# Patient Record
Sex: Male | Born: 1950 | Race: White | Hispanic: No | Smoking: Former smoker
Health system: Southern US, Community
[De-identification: ages and names within clinical notes are randomized; demographics above are authoritative.]

## PROBLEM LIST (undated history)

## (undated) DIAGNOSIS — M1990 Unspecified osteoarthritis, unspecified site: Secondary | ICD-10-CM

## (undated) DIAGNOSIS — R7303 Prediabetes: Secondary | ICD-10-CM

## (undated) DIAGNOSIS — I10 Essential (primary) hypertension: Secondary | ICD-10-CM

## (undated) DIAGNOSIS — K219 Gastro-esophageal reflux disease without esophagitis: Secondary | ICD-10-CM

## (undated) HISTORY — PX: UPPER GI ENDOSCOPY: SHX6162

## (undated) HISTORY — PX: COLONOSCOPY: SHX174

## (undated) HISTORY — PX: FOOT SURGERY: SHX648

---

## 2007-12-09 ENCOUNTER — Ambulatory Visit (HOSPITAL_COMMUNITY): Admission: RE | Admit: 2007-12-09 | Discharge: 2007-12-09 | Payer: Self-pay | Admitting: *Deleted

## 2007-12-09 ENCOUNTER — Encounter (INDEPENDENT_AMBULATORY_CARE_PROVIDER_SITE_OTHER): Payer: Self-pay | Admitting: *Deleted

## 2011-02-10 NOTE — Op Note (Signed)
NAME:  Ronnie Moody, Ronnie Moody NO.:  [id number]   MEDICAL RECORD NO.:  [id number]          PATIENT TYPE:  AMB   LOCATION:  ENDO                         FACILITY:  [facility]   PHYSICIAN:  Georgiana Spinner, M.D.    DATE OF BIRTH:  12/14/1950   DATE OF PROCEDURE:  12/09/2007  DATE OF DISCHARGE:                               OPERATIVE REPORT   PROCEDURE:  Colonoscopy.   ENDOSCOPIST:  Georgiana Spinner, M.D.   INDICATIONS:  Colon cancer screening.   ANESTHESIA:  Demerol 30, Versed 3 mg.   PROCEDURE:  With the patient mildly sedated in the left lateral  decubitus position, a rectal examination was performed which was  unremarkable to my examination.  The prostate felt normal. Subsequently,  the Pentax videoscopic colonoscope was inserted in the rectum and passed  under direct vision with pressure applied to reach the cecum, identified  by ileocecal valve and appendiceal orifice, both which were  photographed.  From this point, the colonoscope was slowly withdrawn,  taking circumferential views of the colonic mucosa, stopping only at  approximately 25 cm from anal verge, at which point approximately a 7-mm  polyp was seen, photographed and removed using snare cautery technique,  setting of 20/150 blended current.  The tissue was retrieved for  Pathology, suctioning it into a tissue trap.  The endoscope was then  withdrawn all the way to the rectum, which appeared normal on direct and  retroflexed views.  The endoscope was straightened and withdrawn.  The  patient's vital signs and pulse oximetry remained stable.  The patient  tolerated procedure well without apparent complications.   FINDINGS:  Polyp at 25 cm, otherwise unremarkable exam.   PLAN:  Await biopsy report.  The patient will call me for results and  follow up with me as an outpatient as indicated.  See endoscopy note as  well for followup.           ______________________________  Georgiana Spinner, M.D.     GMO/MEDQ   D:  12/09/2007  T:  12/10/2007  Job:  [id number]   cc:   Caryn Bee L. Little, M.D.  Fax: [phone]

## 2011-02-10 NOTE — Op Note (Signed)
NAME:  Ronnie Moody, Ronnie Moody NO.:  [id number]   MEDICAL RECORD NO.:  [id number]          PATIENT TYPE:  AMB   LOCATION:  ENDO                         FACILITY:  [facility]   PHYSICIAN:  Georgiana Spinner, M.D.    DATE OF BIRTH:  1951/02/11   DATE OF PROCEDURE:  12/09/2007  DATE OF DISCHARGE:                               OPERATIVE REPORT   PROCEDURE:  Upper endoscopy.   INDICATIONS:  GERD.   ANESTHESIA:  Demerol 70 mg, Versed 7 mg.   PROCEDURE:  With the patient mildly sedated in the left lateral  decubitus position, the Pentax videoscopic endoscope was inserted the  mouth, passed under direct vision through the esophagus, which appeared  normal until we reached distal esophagus, and there were clear-cut  changes of Barrett's, photographed, and multiple biopsies taken.  We  then entered into the stomach.  Fundus, body, antrum, duodenal bulb,  second portion of duodenum were visualized and appeared normal.  From  this point the endoscope was slowly withdrawn taking circumferential  views of the duodenal mucosa until the endoscope had been pulled back  into stomach, placed in retroflexion to view the stomach from below.  The endoscope was straightened and withdrawn taking circumferential  views of the remaining gastric and esophageal mucosa.  The patient's  vital signs and pulse oximeter remained stable.  The patient tolerated  the procedure well without apparent complications.   FINDINGS:  Endoscopic findings of Barrett's esophagus, biopsied.   Await biopsy report.  The patient will call me for results and follow up  with me as an outpatient.           ______________________________  Georgiana Spinner, M.D.     GMO/MEDQ  D:  12/09/2007  T:  12/10/2007  Job:  [id number]   cc:   Caryn Bee L. Little, M.D.  Fax: [phone]

## 2013-05-04 ENCOUNTER — Other Ambulatory Visit: Payer: Self-pay | Admitting: Dermatology

## 2013-08-08 ENCOUNTER — Other Ambulatory Visit: Payer: Self-pay | Admitting: Gastroenterology

## 2014-03-21 ENCOUNTER — Other Ambulatory Visit: Payer: Self-pay | Admitting: Dermatology

## 2014-12-06 ENCOUNTER — Other Ambulatory Visit: Payer: Self-pay | Admitting: Dermatology

## 2016-01-21 DIAGNOSIS — L570 Actinic keratosis: Secondary | ICD-10-CM | POA: Diagnosis not present

## 2016-03-12 DIAGNOSIS — J0100 Acute maxillary sinusitis, unspecified: Secondary | ICD-10-CM | POA: Diagnosis not present

## 2016-04-21 DIAGNOSIS — D0439 Carcinoma in situ of skin of other parts of face: Secondary | ICD-10-CM | POA: Diagnosis not present

## 2016-04-21 DIAGNOSIS — D2272 Melanocytic nevi of left lower limb, including hip: Secondary | ICD-10-CM | POA: Diagnosis not present

## 2016-04-21 DIAGNOSIS — L821 Other seborrheic keratosis: Secondary | ICD-10-CM | POA: Diagnosis not present

## 2016-04-21 DIAGNOSIS — D044 Carcinoma in situ of skin of scalp and neck: Secondary | ICD-10-CM | POA: Diagnosis not present

## 2016-04-21 DIAGNOSIS — L570 Actinic keratosis: Secondary | ICD-10-CM | POA: Diagnosis not present

## 2016-04-21 DIAGNOSIS — D485 Neoplasm of uncertain behavior of skin: Secondary | ICD-10-CM | POA: Diagnosis not present

## 2016-04-21 DIAGNOSIS — Z85828 Personal history of other malignant neoplasm of skin: Secondary | ICD-10-CM | POA: Diagnosis not present

## 2016-07-15 DIAGNOSIS — E7800 Pure hypercholesterolemia, unspecified: Secondary | ICD-10-CM | POA: Diagnosis not present

## 2016-07-15 DIAGNOSIS — Z0000 Encounter for general adult medical examination without abnormal findings: Secondary | ICD-10-CM | POA: Diagnosis not present

## 2016-07-15 DIAGNOSIS — R7301 Impaired fasting glucose: Secondary | ICD-10-CM | POA: Diagnosis not present

## 2016-07-15 DIAGNOSIS — Z23 Encounter for immunization: Secondary | ICD-10-CM | POA: Diagnosis not present

## 2016-07-15 DIAGNOSIS — Z125 Encounter for screening for malignant neoplasm of prostate: Secondary | ICD-10-CM | POA: Diagnosis not present

## 2016-08-07 DIAGNOSIS — Z85828 Personal history of other malignant neoplasm of skin: Secondary | ICD-10-CM | POA: Diagnosis not present

## 2016-08-07 DIAGNOSIS — L570 Actinic keratosis: Secondary | ICD-10-CM | POA: Diagnosis not present

## 2016-08-07 DIAGNOSIS — D2272 Melanocytic nevi of left lower limb, including hip: Secondary | ICD-10-CM | POA: Diagnosis not present

## 2017-02-24 DIAGNOSIS — L821 Other seborrheic keratosis: Secondary | ICD-10-CM | POA: Diagnosis not present

## 2017-02-24 DIAGNOSIS — D2272 Melanocytic nevi of left lower limb, including hip: Secondary | ICD-10-CM | POA: Diagnosis not present

## 2017-02-24 DIAGNOSIS — L570 Actinic keratosis: Secondary | ICD-10-CM | POA: Diagnosis not present

## 2017-02-24 DIAGNOSIS — Z85828 Personal history of other malignant neoplasm of skin: Secondary | ICD-10-CM | POA: Diagnosis not present

## 2017-04-09 DIAGNOSIS — J320 Chronic maxillary sinusitis: Secondary | ICD-10-CM | POA: Diagnosis not present

## 2017-04-09 DIAGNOSIS — M25511 Pain in right shoulder: Secondary | ICD-10-CM | POA: Diagnosis not present

## 2017-07-22 DIAGNOSIS — L570 Actinic keratosis: Secondary | ICD-10-CM | POA: Diagnosis not present

## 2017-07-22 DIAGNOSIS — Z0000 Encounter for general adult medical examination without abnormal findings: Secondary | ICD-10-CM | POA: Diagnosis not present

## 2017-07-22 DIAGNOSIS — J309 Allergic rhinitis, unspecified: Secondary | ICD-10-CM | POA: Diagnosis not present

## 2017-07-22 DIAGNOSIS — I10 Essential (primary) hypertension: Secondary | ICD-10-CM | POA: Diagnosis not present

## 2017-07-23 DIAGNOSIS — K4090 Unilateral inguinal hernia, without obstruction or gangrene, not specified as recurrent: Secondary | ICD-10-CM | POA: Diagnosis not present

## 2017-07-23 DIAGNOSIS — N400 Enlarged prostate without lower urinary tract symptoms: Secondary | ICD-10-CM | POA: Diagnosis not present

## 2017-07-23 DIAGNOSIS — I10 Essential (primary) hypertension: Secondary | ICD-10-CM | POA: Diagnosis not present

## 2017-07-23 DIAGNOSIS — Z0000 Encounter for general adult medical examination without abnormal findings: Secondary | ICD-10-CM | POA: Diagnosis not present

## 2017-07-23 DIAGNOSIS — Z125 Encounter for screening for malignant neoplasm of prostate: Secondary | ICD-10-CM | POA: Diagnosis not present

## 2017-07-23 DIAGNOSIS — E7800 Pure hypercholesterolemia, unspecified: Secondary | ICD-10-CM | POA: Diagnosis not present

## 2017-07-28 DIAGNOSIS — M19012 Primary osteoarthritis, left shoulder: Secondary | ICD-10-CM | POA: Diagnosis not present

## 2017-07-28 DIAGNOSIS — M19011 Primary osteoarthritis, right shoulder: Secondary | ICD-10-CM | POA: Diagnosis not present

## 2017-08-11 DIAGNOSIS — M25511 Pain in right shoulder: Secondary | ICD-10-CM | POA: Diagnosis not present

## 2017-08-11 DIAGNOSIS — G8929 Other chronic pain: Secondary | ICD-10-CM | POA: Diagnosis not present

## 2017-08-11 DIAGNOSIS — M25512 Pain in left shoulder: Secondary | ICD-10-CM | POA: Diagnosis not present

## 2017-08-11 DIAGNOSIS — M19012 Primary osteoarthritis, left shoulder: Secondary | ICD-10-CM | POA: Diagnosis not present

## 2017-08-23 DIAGNOSIS — M19011 Primary osteoarthritis, right shoulder: Secondary | ICD-10-CM | POA: Diagnosis not present

## 2017-08-27 DIAGNOSIS — L821 Other seborrheic keratosis: Secondary | ICD-10-CM | POA: Diagnosis not present

## 2017-08-27 DIAGNOSIS — D2272 Melanocytic nevi of left lower limb, including hip: Secondary | ICD-10-CM | POA: Diagnosis not present

## 2017-08-27 DIAGNOSIS — D485 Neoplasm of uncertain behavior of skin: Secondary | ICD-10-CM | POA: Diagnosis not present

## 2017-08-27 DIAGNOSIS — D2271 Melanocytic nevi of right lower limb, including hip: Secondary | ICD-10-CM | POA: Diagnosis not present

## 2017-08-27 DIAGNOSIS — L570 Actinic keratosis: Secondary | ICD-10-CM | POA: Diagnosis not present

## 2017-09-08 DIAGNOSIS — M19011 Primary osteoarthritis, right shoulder: Secondary | ICD-10-CM | POA: Diagnosis not present

## 2018-02-24 DIAGNOSIS — D2272 Melanocytic nevi of left lower limb, including hip: Secondary | ICD-10-CM | POA: Diagnosis not present

## 2018-02-24 DIAGNOSIS — D2271 Melanocytic nevi of right lower limb, including hip: Secondary | ICD-10-CM | POA: Diagnosis not present

## 2018-02-24 DIAGNOSIS — L570 Actinic keratosis: Secondary | ICD-10-CM | POA: Diagnosis not present

## 2018-02-24 DIAGNOSIS — Z85828 Personal history of other malignant neoplasm of skin: Secondary | ICD-10-CM | POA: Diagnosis not present

## 2018-07-14 DIAGNOSIS — D485 Neoplasm of uncertain behavior of skin: Secondary | ICD-10-CM | POA: Diagnosis not present

## 2018-07-14 DIAGNOSIS — D044 Carcinoma in situ of skin of scalp and neck: Secondary | ICD-10-CM | POA: Diagnosis not present

## 2018-07-14 DIAGNOSIS — Z23 Encounter for immunization: Secondary | ICD-10-CM | POA: Diagnosis not present

## 2018-08-10 DIAGNOSIS — D044 Carcinoma in situ of skin of scalp and neck: Secondary | ICD-10-CM | POA: Diagnosis not present

## 2018-08-31 DIAGNOSIS — R7301 Impaired fasting glucose: Secondary | ICD-10-CM | POA: Diagnosis not present

## 2018-08-31 DIAGNOSIS — Z8719 Personal history of other diseases of the digestive system: Secondary | ICD-10-CM | POA: Diagnosis not present

## 2018-08-31 DIAGNOSIS — R8290 Unspecified abnormal findings in urine: Secondary | ICD-10-CM | POA: Diagnosis not present

## 2018-08-31 DIAGNOSIS — Z0000 Encounter for general adult medical examination without abnormal findings: Secondary | ICD-10-CM | POA: Diagnosis not present

## 2018-08-31 DIAGNOSIS — Z23 Encounter for immunization: Secondary | ICD-10-CM | POA: Diagnosis not present

## 2018-08-31 DIAGNOSIS — I10 Essential (primary) hypertension: Secondary | ICD-10-CM | POA: Diagnosis not present

## 2018-08-31 DIAGNOSIS — H6122 Impacted cerumen, left ear: Secondary | ICD-10-CM | POA: Diagnosis not present

## 2018-09-02 ENCOUNTER — Other Ambulatory Visit: Payer: Self-pay | Admitting: Family Medicine

## 2018-09-02 DIAGNOSIS — Z87891 Personal history of nicotine dependence: Secondary | ICD-10-CM

## 2018-09-08 ENCOUNTER — Ambulatory Visit: Payer: Self-pay

## 2018-09-14 ENCOUNTER — Ambulatory Visit
Admission: RE | Admit: 2018-09-14 | Discharge: 2018-09-14 | Disposition: A | Discharge status: Home / Self Care | Payer: BLUE CROSS/BLUE SHIELD | Source: Ambulatory Visit | Attending: Family Medicine | Admitting: Family Medicine

## 2018-09-14 DIAGNOSIS — Z136 Encounter for screening for cardiovascular disorders: Secondary | ICD-10-CM | POA: Diagnosis not present

## 2018-09-14 DIAGNOSIS — Z87891 Personal history of nicotine dependence: Secondary | ICD-10-CM

## 2018-10-03 DIAGNOSIS — K4020 Bilateral inguinal hernia, without obstruction or gangrene, not specified as recurrent: Secondary | ICD-10-CM | POA: Diagnosis not present

## 2018-10-07 DIAGNOSIS — L570 Actinic keratosis: Secondary | ICD-10-CM | POA: Diagnosis not present

## 2018-10-07 DIAGNOSIS — D485 Neoplasm of uncertain behavior of skin: Secondary | ICD-10-CM | POA: Diagnosis not present

## 2018-10-07 DIAGNOSIS — Z85828 Personal history of other malignant neoplasm of skin: Secondary | ICD-10-CM | POA: Diagnosis not present

## 2018-10-07 DIAGNOSIS — D045 Carcinoma in situ of skin of trunk: Secondary | ICD-10-CM | POA: Diagnosis not present

## 2018-10-07 DIAGNOSIS — L821 Other seborrheic keratosis: Secondary | ICD-10-CM | POA: Diagnosis not present

## 2018-10-07 DIAGNOSIS — D2272 Melanocytic nevi of left lower limb, including hip: Secondary | ICD-10-CM | POA: Diagnosis not present

## 2018-10-07 DIAGNOSIS — D2271 Melanocytic nevi of right lower limb, including hip: Secondary | ICD-10-CM | POA: Diagnosis not present

## 2018-11-24 DIAGNOSIS — D045 Carcinoma in situ of skin of trunk: Secondary | ICD-10-CM | POA: Diagnosis not present

## 2019-04-06 DIAGNOSIS — L814 Other melanin hyperpigmentation: Secondary | ICD-10-CM | POA: Diagnosis not present

## 2019-04-06 DIAGNOSIS — D045 Carcinoma in situ of skin of trunk: Secondary | ICD-10-CM | POA: Diagnosis not present

## 2019-04-06 DIAGNOSIS — D2272 Melanocytic nevi of left lower limb, including hip: Secondary | ICD-10-CM | POA: Diagnosis not present

## 2019-04-06 DIAGNOSIS — L821 Other seborrheic keratosis: Secondary | ICD-10-CM | POA: Diagnosis not present

## 2019-04-06 DIAGNOSIS — D2271 Melanocytic nevi of right lower limb, including hip: Secondary | ICD-10-CM | POA: Diagnosis not present

## 2019-04-06 DIAGNOSIS — L570 Actinic keratosis: Secondary | ICD-10-CM | POA: Diagnosis not present

## 2019-04-06 DIAGNOSIS — Z85828 Personal history of other malignant neoplasm of skin: Secondary | ICD-10-CM | POA: Diagnosis not present

## 2019-04-06 DIAGNOSIS — D485 Neoplasm of uncertain behavior of skin: Secondary | ICD-10-CM | POA: Diagnosis not present

## 2019-05-02 DIAGNOSIS — M25512 Pain in left shoulder: Secondary | ICD-10-CM | POA: Diagnosis not present

## 2019-05-02 DIAGNOSIS — M25511 Pain in right shoulder: Secondary | ICD-10-CM | POA: Diagnosis not present

## 2019-05-03 DIAGNOSIS — A499 Bacterial infection, unspecified: Secondary | ICD-10-CM | POA: Diagnosis not present

## 2019-05-03 DIAGNOSIS — L98491 Non-pressure chronic ulcer of skin of other sites limited to breakdown of skin: Secondary | ICD-10-CM | POA: Diagnosis not present

## 2019-05-15 IMAGING — US US ABDOMINAL AORTA SCREENING AAA
1 series · 7 of 7 positions shown · non-contrast
Comparison: None.

CLINICAL DATA: Male between 65-75 years of age with a smoking
history.

EXAM:
US ABDOMINAL AORTA MEDICARE SCREENING
TECHNIQUE: Ultrasound examination of the abdominal aorta was performed as a
screening evaluation for abdominal aortic aneurysm.

[Series 1: us abdominal aorta screening aaa · 0.22mm/px · 7 of 7 slices shown]
[im 1/7]
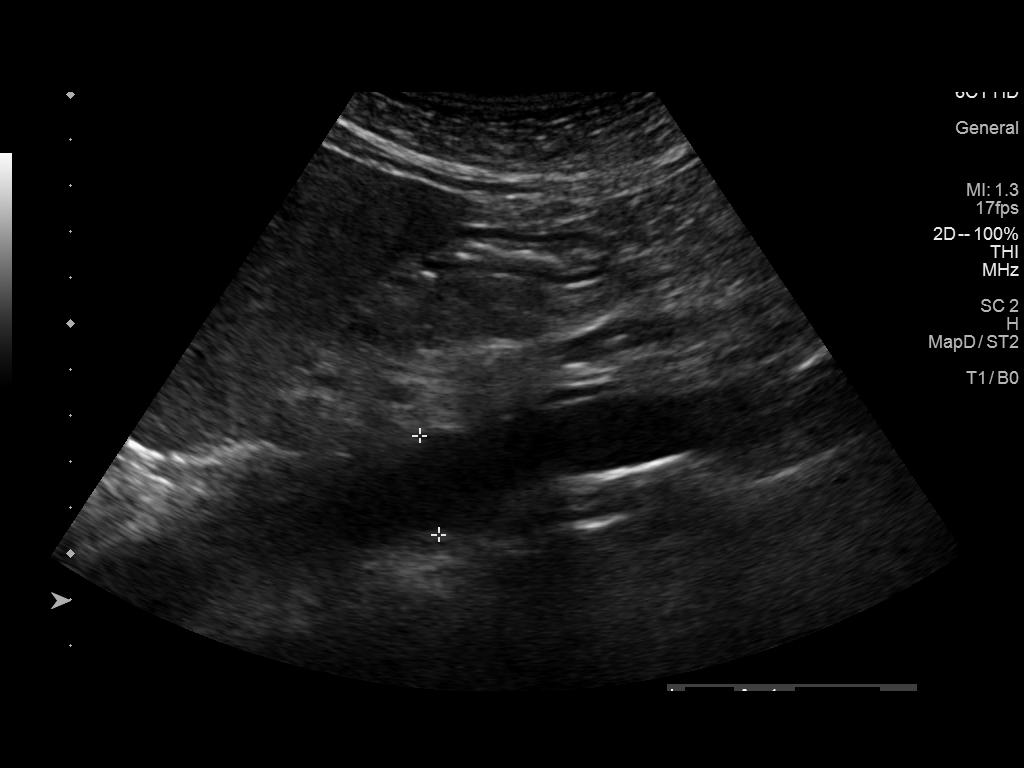
[im 2/7]
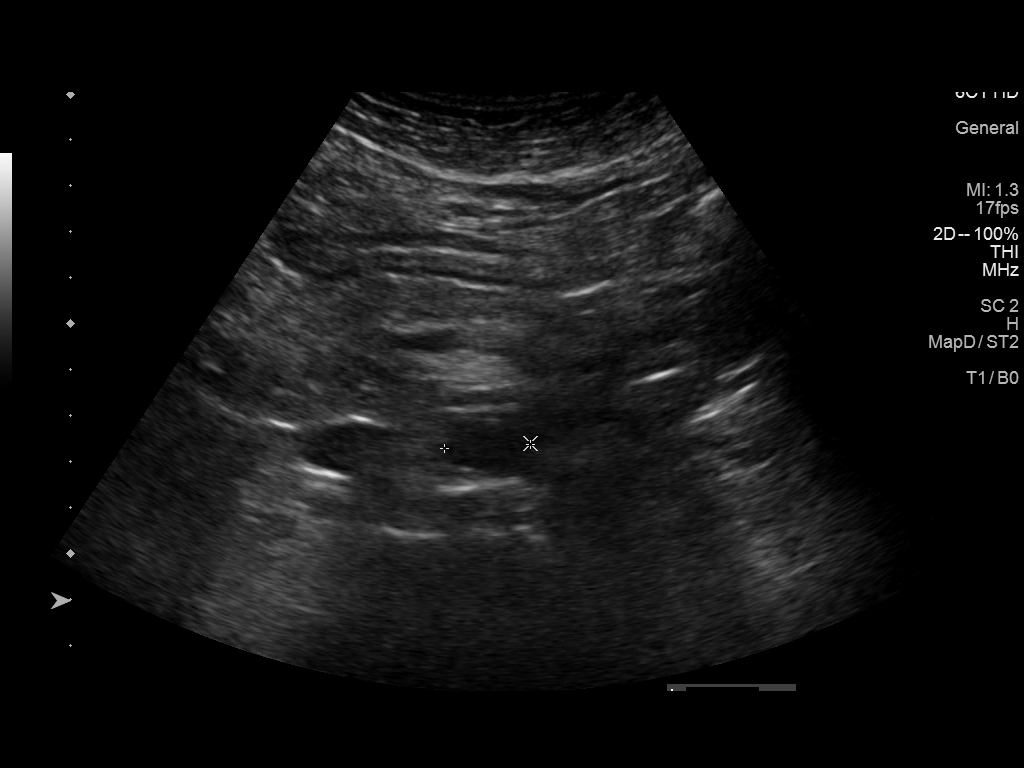
[im 3/7]
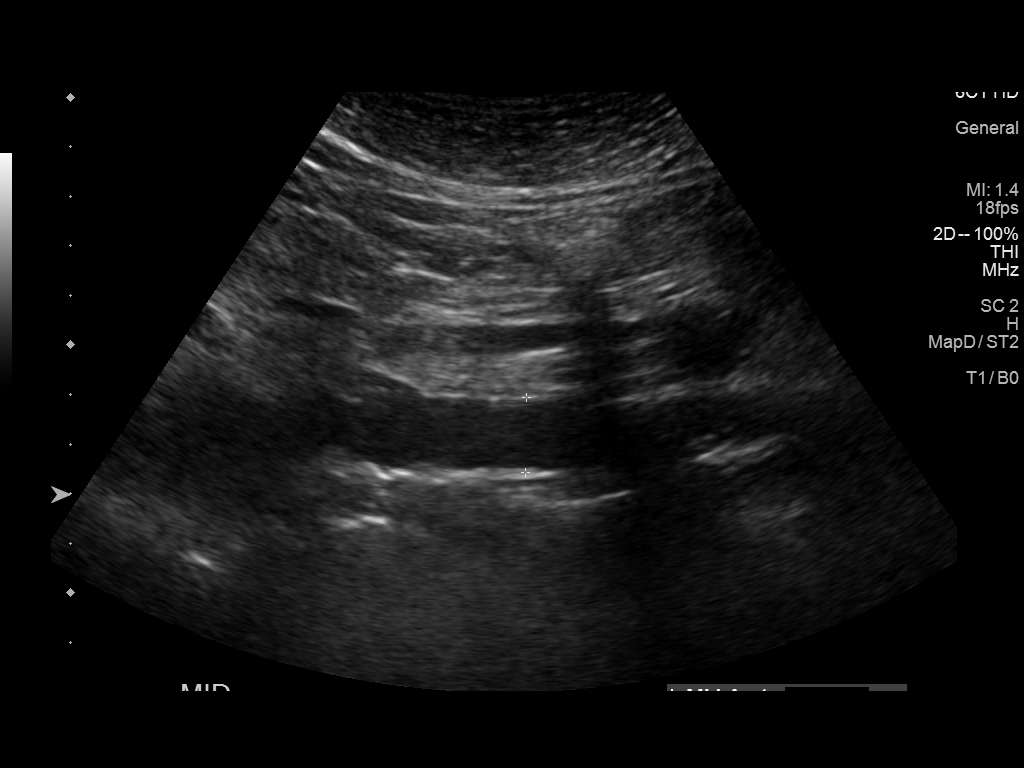
[im 4/7]
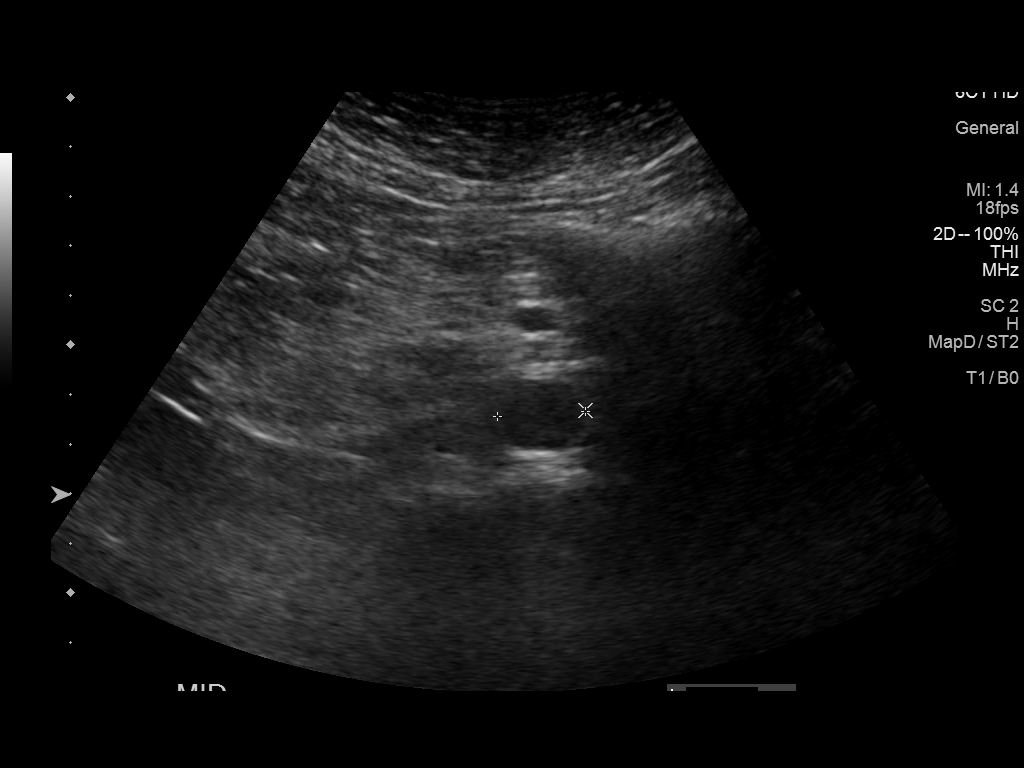
[im 5/7]
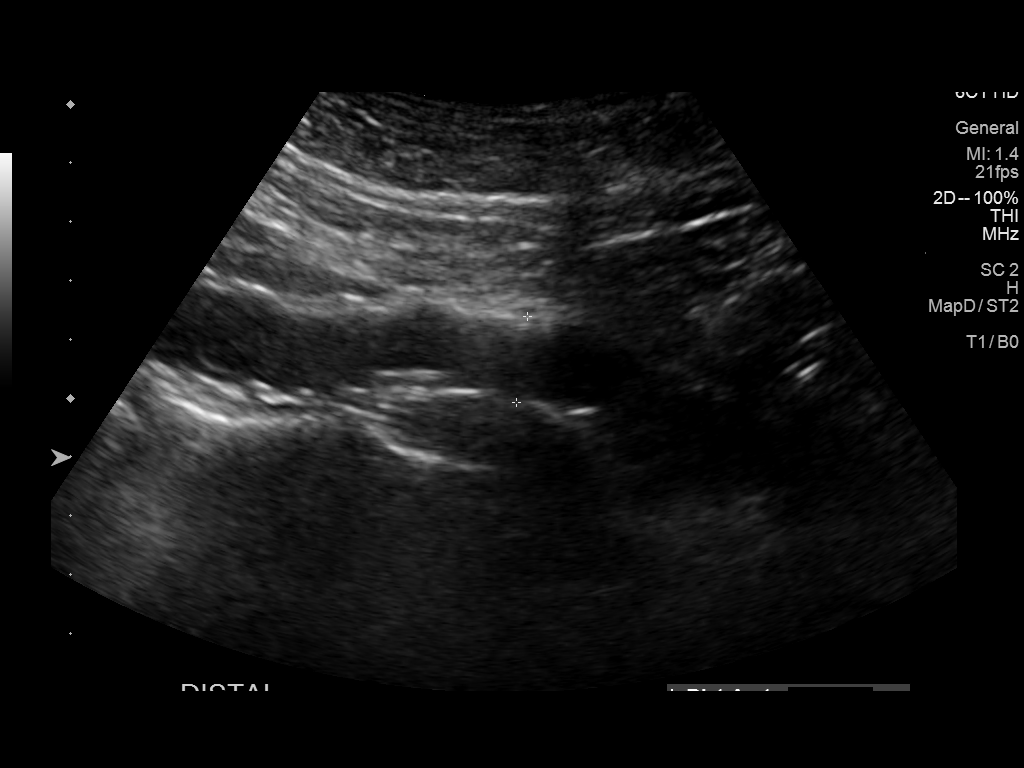
[im 6/7]
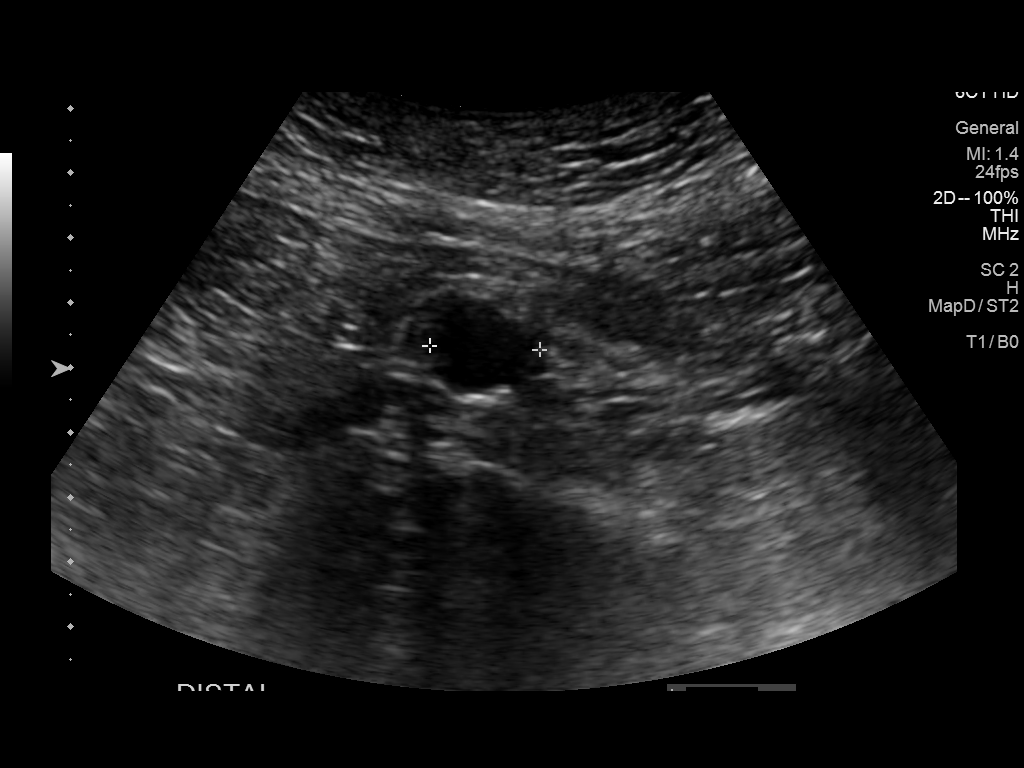
[im 7/7]
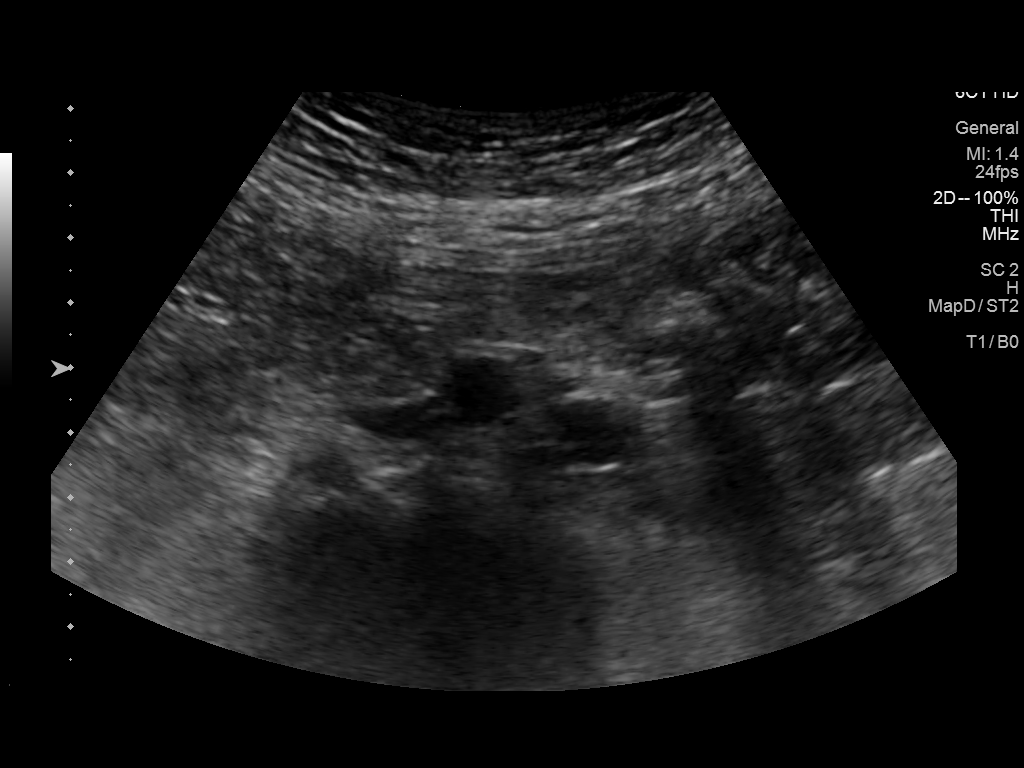

[7 of 7 positions shown; findings below may reference images not displayed]

FINDINGS: Abdominal aortic measurements as follows:

Proximal:  2.2 x 1.9 cm

Mid:  1.5 x 1.8 cm

Distal:  1.5 x 1.7 cm
IMPRESSION: Negative for abdominal aortic aneurysm.

## 2019-05-16 DIAGNOSIS — M25512 Pain in left shoulder: Secondary | ICD-10-CM | POA: Diagnosis not present

## 2019-05-17 DIAGNOSIS — M25512 Pain in left shoulder: Secondary | ICD-10-CM | POA: Diagnosis not present

## 2019-05-17 DIAGNOSIS — D044 Carcinoma in situ of skin of scalp and neck: Secondary | ICD-10-CM | POA: Diagnosis not present

## 2019-05-24 DIAGNOSIS — Z01818 Encounter for other preprocedural examination: Secondary | ICD-10-CM | POA: Diagnosis not present

## 2019-05-24 DIAGNOSIS — R7301 Impaired fasting glucose: Secondary | ICD-10-CM | POA: Diagnosis not present

## 2019-05-24 DIAGNOSIS — I10 Essential (primary) hypertension: Secondary | ICD-10-CM | POA: Diagnosis not present

## 2019-05-24 DIAGNOSIS — M25512 Pain in left shoulder: Secondary | ICD-10-CM | POA: Diagnosis not present

## 2019-05-31 NOTE — Patient Instructions (Addendum)
DUE TO COVID-19 ONLY ONE VISITOR IS ALLOWED TO COME WITH YOU AND STAY IN THE WAITING ROOM ONLY DURING PRE OP AND PROCEDURE. THE ONE VISITOR MAY VISIT WITH YOU IN YOUR PRIVATE ROOM DURING VISITING HOURS ONLY!!   COVID SWAB TESTING MUST BE COMPLETED ON:  Tuesday, Sept 8, 2020 at 2:55PM.   [address] [place] [facility] enter pre surgical testing line (Must self quarantine after testing. Follow instructions on handout.)             Your procedure is scheduled on: Friday, Sept. 11, 2020   Report to [facility] Main  Entrance   Report to Short Stay at 5:30 AM   Call this number if you have problems the morning of surgery [phone]   Do not eat food :After Midnight.   CLEAR LIQUID DIET  Foods Allowed                                                                     Foods Excluded  Water, Black Coffee and tea, regular and decaf                             liquids that you cannot  Plain Jell-O in any flavor  (No red)                                           see through such as: Fruit ices (not with fruit pulp)                                     milk, soups, orange juice  Iced Popsicles (No red)                                    All solid food Carbonated beverages, regular and diet                                    Apple juices Sports drinks like Gatorade (No red) Lightly seasoned clear broth or consume(fat free) Sugar, honey syrup  Sample Menu Breakfast                                Lunch                                     Supper Cranberry juice                    Beef broth                            Chicken broth Jell-O  Grape juice                           Apple juice Coffee or tea                        Jell-O                                      Popsicle                                                Coffee or tea                        Coffee or tea   Drink Ensure pre surgery drink at 4:30AM day  of surgery   Brush your teeth the morning of surgery.   Do NOT smoke after Midnight   Take these medicines the morning of surgery with A SIP OF WATER: Metoprolol, Prilosec                               You may not have any metal on your body including hair pins, jewelry, and body piercings             Do not wear make-up, lotions, powders, perfumes/cologne, or deodorant             Do not wear nail polish.  Do not shave  48 hours prior to surgery.              Men may shave face and neck.   Do not bring valuables to the hospital. [facility] IS NOT             RESPONSIBLE   FOR VALUABLES.   Contacts, dentures or bridgework may not be worn into surgery.   Bring small overnight bag day of surgery.   Special Instructions: Bring a copy of your healthcare power of attorney and living will documents         the day of surgery if you haven't scanned them in before.              Please read over the following fact sheets you were given:  [facility] Before surgery, you can play an important role.  Because skin is not sterile, your skin needs to be as free of germs as possible.  You can reduce the number of germs on your skin by washing with CHG (chlorahexidine gluconate) soap before surgery.  CHG is an antiseptic cleaner which kills germs and bonds with the skin to continue killing germs even after washing. Please DO NOT use if you have an allergy to CHG or antibacterial soaps.  If your skin becomes reddened/irritated stop using the CHG and inform your nurse when you arrive at Short Stay. Do not shave (including legs and underarms) for at least 48 hours prior to the first CHG shower.  You may shave your face/neck.  Please follow these instructions carefully:  1.  Shower with CHG Soap the night before surgery and the  morning of surgery.  2.  If you choose to  wash your hair, wash your hair first as usual with your normal  shampoo.  3.  After you shampoo, rinse your hair  and body thoroughly to remove the shampoo.                             4.  Use CHG as you would any other liquid soap.  You can apply chg directly to the skin and wash.  Gently with a scrungie or clean washcloth.  5.  Apply the CHG Soap to your body ONLY FROM THE NECK DOWN.   Do   not use on face/ open                           Wound or open sores. Avoid contact with eyes, ears mouth and   genitals (private parts).                       Wash face,  Genitals (private parts) with your normal soap.             6.  Wash thoroughly, paying special attention to the area where your    surgery  will be performed.  7.  Thoroughly rinse your body with warm water from the neck down.  8.  DO NOT shower/wash with your normal soap after using and rinsing off the CHG Soap.                9.  Pat yourself dry with a clean towel.            10.  Wear clean pajamas.            11.  Place clean sheets on your bed the night of your first shower and do not  sleep with pets. Day of Surgery : Do not apply any lotions/deodorants the morning of surgery.  Please wear clean clothes to the hospital/surgery center.  FAILURE TO FOLLOW THESE INSTRUCTIONS MAY RESULT IN THE CANCELLATION OF YOUR SURGERY  PATIENT SIGNATURE_________________________________  NURSE SIGNATURE__________________________________  ________________________________________________________________________

## 2019-06-01 ENCOUNTER — Encounter (HOSPITAL_COMMUNITY): Payer: Self-pay

## 2019-06-01 ENCOUNTER — Encounter (HOSPITAL_COMMUNITY)
Admission: RE | Admit: 2019-06-01 | Discharge: 2019-06-01 | Disposition: A | Discharge status: Home / Self Care | Payer: BC Managed Care – PPO | Source: Ambulatory Visit | Attending: Orthopedic Surgery | Admitting: Orthopedic Surgery

## 2019-06-01 ENCOUNTER — Encounter (INDEPENDENT_AMBULATORY_CARE_PROVIDER_SITE_OTHER): Payer: Self-pay

## 2019-06-01 ENCOUNTER — Other Ambulatory Visit: Payer: Self-pay

## 2019-06-01 DIAGNOSIS — M12812 Other specific arthropathies, not elsewhere classified, left shoulder: Secondary | ICD-10-CM | POA: Diagnosis not present

## 2019-06-01 DIAGNOSIS — Z01812 Encounter for preprocedural laboratory examination: Secondary | ICD-10-CM | POA: Insufficient documentation

## 2019-06-01 DIAGNOSIS — R7303 Prediabetes: Secondary | ICD-10-CM | POA: Diagnosis not present

## 2019-06-01 DIAGNOSIS — K219 Gastro-esophageal reflux disease without esophagitis: Secondary | ICD-10-CM | POA: Insufficient documentation

## 2019-06-01 DIAGNOSIS — Z79899 Other long term (current) drug therapy: Secondary | ICD-10-CM | POA: Diagnosis not present

## 2019-06-01 DIAGNOSIS — Z87891 Personal history of nicotine dependence: Secondary | ICD-10-CM | POA: Insufficient documentation

## 2019-06-01 DIAGNOSIS — I10 Essential (primary) hypertension: Secondary | ICD-10-CM | POA: Diagnosis not present

## 2019-06-01 HISTORY — DX: Essential (primary) hypertension: I10

## 2019-06-01 HISTORY — DX: Gastro-esophageal reflux disease without esophagitis: K21.9

## 2019-06-01 HISTORY — DX: Unspecified osteoarthritis, unspecified site: M19.90

## 2019-06-01 HISTORY — DX: Prediabetes: R73.03

## 2019-06-01 LAB — CBC
HCT: 40.6 % (ref 39.0–52.0)
Hemoglobin: 14.3 g/dL (ref 13.0–17.0)
MCH: 33.9 pg (ref 26.0–34.0)
MCHC: 35.2 g/dL (ref 30.0–36.0)
MCV: 96.2 fL (ref 80.0–100.0)
Platelets: 247 10*3/uL (ref 150–400)
RBC: 4.22 MIL/uL (ref 4.22–5.81)
RDW: 11.5 % (ref 11.5–15.5)
WBC: 8.5 10*3/uL (ref 4.0–10.5)
nRBC: 0 % (ref 0.0–0.2)

## 2019-06-01 LAB — SURGICAL PCR SCREEN
MRSA, PCR: NEGATIVE
Staphylococcus aureus: NEGATIVE

## 2019-06-01 LAB — HEMOGLOBIN A1C
Hgb A1c MFr Bld: 5.7 % — ABNORMAL HIGH (ref 4.8–5.6)
Mean Plasma Glucose: 116.89 mg/dL

## 2019-06-01 NOTE — Progress Notes (Signed)
SPOKE W/  Torie     SCREENING SYMPTOMS OF COVID 19:   COUGH--NO  RUNNY NOSE--- NO  SORE THROAT---NO  NASAL CONGESTION----NO  SNEEZING----NO  SHORTNESS OF BREATH---NO  DIFFICULTY BREATHING---NO  TEMP >100.0 -----NO  UNEXPLAINED BODY ACHES------NO  CHILLS -------- NO  HEADACHES ---------NO  LOSS OF SMELL/ TASTE --------NO    HAVE YOU OR ANY FAMILY MEMBER TRAVELLED PAST 14 DAYS OUT OF THE   COUNTY---Travels to [place] for work STATE----NO COUNTRY----NO  HAVE YOU OR ANY FAMILY MEMBER BEEN EXPOSED TO ANYONE WITH COVID 19? NO

## 2019-06-01 NOTE — H&P (Signed)
  Patient's anticipated LOS is less than 2 midnights, meeting these requirements: - Younger than 48 - Lives within 1 hour of care - Has a competent adult at home to recover with post-op recover - NO history of  - Chronic pain requiring opiods  - Diabetes  - Coronary Artery Disease  - Heart failure  - Heart attack  - Stroke  - DVT/VTE  - Cardiac arrhythmia  - Respiratory Failure/COPD  - Renal failure  - Anemia  - Advanced Liver disease       Ronnie Moody is an 68 y.o. male.    Chief Complaint: left shoulder pain  HPI: Pt is a 68 y.o. male complaining of left shoulder pain for multiple years. Pain had continually increased since the beginning. X-rays in the clinic show end-stage arthritic changes of the left shoulder. Pt has tried various conservative treatments which have failed to alleviate their symptoms, including injections and therapy. Various options are discussed with the patient. Risks, benefits and expectations were discussed with the patient. Patient understand the risks, benefits and expectations and wishes to proceed with surgery.   PCP:  Hulan Fess, MD  D/C Plans: Home  PMH: Past Medical History:  Diagnosis Date  . Arthritis   . GERD (gastroesophageal reflux disease)   . Hypertension   . Pre-diabetes     PSH: Past Surgical History:  Procedure Laterality Date  . COLONOSCOPY    . FOOT SURGERY Bilateral    Neuromas  . UPPER GI ENDOSCOPY      Social History:  reports that he has quit smoking. His smoking use included cigarettes. He has never used smokeless tobacco. He reports current alcohol use. He reports previous drug use.  Allergies:  No Known Allergies  Medications: No current facility-administered medications for this encounter.    No current outpatient medications on file.    No results found for this or any previous visit (from the past 48 hour(s)). No results found.  ROS: Pain with rom of the left upper extremity  Physical Exam:  Alert and oriented 68 y.o. male in no acute distress Cranial nerves 2-12 intact Cervical spine: full rom with no tenderness, nv intact distally Chest: active breath sounds bilaterally, no wheeze rhonchi or rales Heart: regular rate and rhythm, no murmur Abd: non tender non distended with active bowel sounds Hip is stable with rom  Left shoulder with limited rom due to weakness and pain nv intact distally No rashes or edema  Assessment/Plan Assessment: left shoulder cuff arthropathy  Plan:  Patient will undergo a left reverse total shoulder by Dr. Veverly Fells at [facility]. Risks benefits and expectations were discussed with the patient. Patient understand risks, benefits and expectations and wishes to proceed. Preoperative templating of the joint replacement has been completed, documented, and submitted to the Operating Room personnel in order to optimize intra-operative equipment management.   Merla Riches PA-C, MPAS [facility] is now [employer] [address], [place], [place], [place] [address] Phone: [phone] [email or web address] Facebook  [employer]

## 2019-06-01 NOTE — Progress Notes (Addendum)
PCP - Dr. Hulan Fess Cardiologist - N/A  Chest x-ray - N/A EKG - 05/24/2019 in chart from dr. Eddie Dibbles office Stress Test - N/A ECHO - N/A Cardiac Cath - N/A  Sleep Study - N/A CPAP - N/A  Fasting Blood Sugar - N/A Checks Blood Sugar _N/A____ times a day  Blood Thinner Instructions:N/A Aspirin Instructions:N/A Last Dose:N/A  Anesthesia review: N/A  Patient denies shortness of breath, fever, cough and chest pain at PAT appointment   Patient verbalized understanding of instructions that were given to them at the PAT appointment. Patient was also instructed that they will need to review over the PAT instructions again at home before surgery.

## 2019-06-06 ENCOUNTER — Other Ambulatory Visit (HOSPITAL_COMMUNITY)
Admission: RE | Admit: 2019-06-06 | Discharge: 2019-06-06 | Disposition: A | Discharge status: Home / Self Care | Payer: BC Managed Care – PPO | Source: Ambulatory Visit | Attending: Orthopedic Surgery | Admitting: Orthopedic Surgery

## 2019-06-06 DIAGNOSIS — Z20828 Contact with and (suspected) exposure to other viral communicable diseases: Secondary | ICD-10-CM | POA: Diagnosis not present

## 2019-06-06 DIAGNOSIS — Z01812 Encounter for preprocedural laboratory examination: Secondary | ICD-10-CM | POA: Diagnosis not present

## 2019-06-08 LAB — NOVEL CORONAVIRUS, NAA (HOSP ORDER, SEND-OUT TO REF LAB; TAT 18-24 HRS): SARS-CoV-2, NAA: NOT DETECTED

## 2019-06-09 ENCOUNTER — Inpatient Hospital Stay (HOSPITAL_COMMUNITY)
Admission: RE | Admit: 2019-06-09 | Discharge: 2019-06-10 | DRG: 483 | Disposition: A | Discharge status: Home / Self Care | Payer: BC Managed Care – PPO | Attending: Orthopedic Surgery | Admitting: Orthopedic Surgery

## 2019-06-09 ENCOUNTER — Inpatient Hospital Stay (HOSPITAL_COMMUNITY): Payer: BC Managed Care – PPO | Admitting: Certified Registered"

## 2019-06-09 ENCOUNTER — Other Ambulatory Visit: Payer: Self-pay

## 2019-06-09 ENCOUNTER — Encounter (HOSPITAL_COMMUNITY): Payer: Self-pay

## 2019-06-09 ENCOUNTER — Inpatient Hospital Stay (HOSPITAL_COMMUNITY): Payer: BC Managed Care – PPO

## 2019-06-09 ENCOUNTER — Encounter (HOSPITAL_COMMUNITY)
Admission: RE | Disposition: A | Discharge status: Home / Self Care | Payer: Self-pay | Source: Home / Self Care | Attending: Orthopedic Surgery

## 2019-06-09 DIAGNOSIS — R7303 Prediabetes: Secondary | ICD-10-CM | POA: Diagnosis not present

## 2019-06-09 DIAGNOSIS — M19012 Primary osteoarthritis, left shoulder: Secondary | ICD-10-CM | POA: Diagnosis not present

## 2019-06-09 DIAGNOSIS — M75100 Unspecified rotator cuff tear or rupture of unspecified shoulder, not specified as traumatic: Secondary | ICD-10-CM | POA: Diagnosis not present

## 2019-06-09 DIAGNOSIS — Z96612 Presence of left artificial shoulder joint: Secondary | ICD-10-CM | POA: Diagnosis not present

## 2019-06-09 DIAGNOSIS — E785 Hyperlipidemia, unspecified: Secondary | ICD-10-CM | POA: Diagnosis not present

## 2019-06-09 DIAGNOSIS — G8918 Other acute postprocedural pain: Secondary | ICD-10-CM | POA: Diagnosis not present

## 2019-06-09 DIAGNOSIS — I10 Essential (primary) hypertension: Secondary | ICD-10-CM | POA: Diagnosis present

## 2019-06-09 DIAGNOSIS — K219 Gastro-esophageal reflux disease without esophagitis: Secondary | ICD-10-CM | POA: Diagnosis not present

## 2019-06-09 DIAGNOSIS — M75102 Unspecified rotator cuff tear or rupture of left shoulder, not specified as traumatic: Secondary | ICD-10-CM | POA: Diagnosis not present

## 2019-06-09 DIAGNOSIS — Z87891 Personal history of nicotine dependence: Secondary | ICD-10-CM

## 2019-06-09 DIAGNOSIS — M25712 Osteophyte, left shoulder: Secondary | ICD-10-CM | POA: Diagnosis present

## 2019-06-09 DIAGNOSIS — M25512 Pain in left shoulder: Secondary | ICD-10-CM | POA: Diagnosis not present

## 2019-06-09 DIAGNOSIS — Z471 Aftercare following joint replacement surgery: Secondary | ICD-10-CM | POA: Diagnosis not present

## 2019-06-09 HISTORY — PX: REVERSE SHOULDER ARTHROPLASTY: SHX5054

## 2019-06-09 SURGERY — ARTHROPLASTY, SHOULDER, TOTAL, REVERSE
Anesthesia: Regional | Site: Shoulder | Laterality: Left

## 2019-06-09 MED ORDER — MIDAZOLAM HCL 5 MG/5ML IJ SOLN
INTRAMUSCULAR | Status: DC | PRN
  Administered 2019-06-09: 2 mg via INTRAVENOUS

## 2019-06-09 MED ORDER — PROPOFOL 10 MG/ML IV BOLUS
INTRAVENOUS | Status: DC | PRN
  Administered 2019-06-09: 150 mg via INTRAVENOUS

## 2019-06-09 MED ORDER — BISACODYL 10 MG RE SUPP: 10.0000 mg | Freq: Every day | RECTAL | Status: DC | PRN

## 2019-06-09 MED ORDER — LACTATED RINGERS IV SOLN
INTRAVENOUS | Status: DC
  Administered 2019-06-09 (×3): via INTRAVENOUS

## 2019-06-09 MED ORDER — SODIUM CHLORIDE 0.9 % IV SOLN
INTRAVENOUS | Status: DC | PRN
  Administered 2019-06-09: 08:00:00 25 ug/min via INTRAVENOUS

## 2019-06-09 MED ORDER — FENTANYL CITRATE (PF) 100 MCG/2ML IJ SOLN
INTRAMUSCULAR | Status: AC
  Filled 2019-06-09: qty 2

## 2019-06-09 MED ORDER — ROCURONIUM BROMIDE 10 MG/ML (PF) SYRINGE
PREFILLED_SYRINGE | INTRAVENOUS | Status: AC
  Filled 2019-06-09: qty 10

## 2019-06-09 MED ORDER — ACETAMINOPHEN 500 MG PO TABS: 1000.0000 mg | ORAL_TABLET | Freq: Once | ORAL | Status: DC

## 2019-06-09 MED ORDER — METOPROLOL SUCCINATE ER 50 MG PO TB24
100.0000 mg | ORAL_TABLET | Freq: Every day | ORAL | Status: DC
  Administered 2019-06-10: 100 mg via ORAL
  Filled 2019-06-09: qty 2

## 2019-06-09 MED ORDER — PHENYLEPHRINE HCL (PRESSORS) 10 MG/ML IV SOLN
INTRAVENOUS | Status: AC
  Filled 2019-06-09: qty 2

## 2019-06-09 MED ORDER — METOCLOPRAMIDE HCL 5 MG PO TABS: 5.0000 mg | ORAL_TABLET | Freq: Three times a day (TID) | ORAL | Status: DC | PRN

## 2019-06-09 MED ORDER — BUPIVACAINE-EPINEPHRINE (PF) 0.25% -1:200000 IJ SOLN
INTRAMUSCULAR | Status: DC | PRN
  Administered 2019-06-09: 10 mL

## 2019-06-09 MED ORDER — ESMOLOL HCL 100 MG/10ML IV SOLN
INTRAVENOUS | Status: AC
  Filled 2019-06-09: qty 10

## 2019-06-09 MED ORDER — LIDOCAINE 2% (20 MG/ML) 5 ML SYRINGE
INTRAMUSCULAR | Status: DC | PRN
  Administered 2019-06-09: 80 mg via INTRAVENOUS

## 2019-06-09 MED ORDER — METHOCARBAMOL 500 MG IVPB - SIMPLE MED
500.0000 mg | Freq: Four times a day (QID) | INTRAVENOUS | Status: DC | PRN
  Filled 2019-06-09: qty 50

## 2019-06-09 MED ORDER — TRANEXAMIC ACID-NACL 1000-0.7 MG/100ML-% IV SOLN
1000.0000 mg | Freq: Once | INTRAVENOUS | Status: AC
  Administered 2019-06-09: 13:00:00 1000 mg via INTRAVENOUS
  Filled 2019-06-09: qty 100

## 2019-06-09 MED ORDER — BUPIVACAINE HCL (PF) 0.5 % IJ SOLN
INTRAMUSCULAR | Status: DC | PRN
  Administered 2019-06-09: 15 mL via PERINEURAL

## 2019-06-09 MED ORDER — LISINOPRIL 10 MG PO TABS
10.0000 mg | ORAL_TABLET | Freq: Every day | ORAL | Status: DC
  Administered 2019-06-10: 10 mg via ORAL
  Filled 2019-06-09: qty 1

## 2019-06-09 MED ORDER — HYDROMORPHONE HCL 1 MG/ML IJ SOLN: 0.5000 mg | INTRAMUSCULAR | Status: DC | PRN

## 2019-06-09 MED ORDER — BUPIVACAINE-EPINEPHRINE (PF) 0.5% -1:200000 IJ SOLN
INTRAMUSCULAR | Status: AC
  Filled 2019-06-09: qty 30

## 2019-06-09 MED ORDER — ONDANSETRON HCL 4 MG/2ML IJ SOLN: 4.0000 mg | Freq: Four times a day (QID) | INTRAMUSCULAR | Status: DC | PRN

## 2019-06-09 MED ORDER — OMEGA-3-ACID ETHYL ESTERS 1 G PO CAPS
1.0000 g | ORAL_CAPSULE | Freq: Every day | ORAL | Status: DC
  Administered 2019-06-10: 1 g via ORAL
  Filled 2019-06-09: qty 1

## 2019-06-09 MED ORDER — ONDANSETRON HCL 4 MG/2ML IJ SOLN
INTRAMUSCULAR | Status: AC
  Filled 2019-06-09: qty 2

## 2019-06-09 MED ORDER — SODIUM CHLORIDE 0.9 % IR SOLN
Status: DC | PRN
  Administered 2019-06-09: 1000 mL

## 2019-06-09 MED ORDER — OXYCODONE HCL 5 MG PO TABS
5.0000 mg | ORAL_TABLET | ORAL | Status: DC | PRN
  Administered 2019-06-09: 5 mg via ORAL
  Administered 2019-06-10: 10 mg via ORAL
  Administered 2019-06-10 (×2): 5 mg via ORAL
  Filled 2019-06-09: qty 1
  Filled 2019-06-09: qty 2
  Filled 2019-06-09 (×2): qty 1

## 2019-06-09 MED ORDER — SUCCINYLCHOLINE CHLORIDE 200 MG/10ML IV SOSY
PREFILLED_SYRINGE | INTRAVENOUS | Status: DC | PRN
  Administered 2019-06-09: 160 mg via INTRAVENOUS

## 2019-06-09 MED ORDER — ACETAMINOPHEN 325 MG PO TABS: 325.0000 mg | ORAL_TABLET | Freq: Four times a day (QID) | ORAL | Status: DC | PRN

## 2019-06-09 MED ORDER — FLUTICASONE PROPIONATE 50 MCG/ACT NA SUSP: 1.0000 | NASAL | Status: DC | PRN

## 2019-06-09 MED ORDER — POLYETHYLENE GLYCOL 3350 17 G PO PACK: 17.0000 g | PACK | Freq: Every day | ORAL | Status: DC | PRN

## 2019-06-09 MED ORDER — HYDROCHLOROTHIAZIDE 12.5 MG PO CAPS
12.5000 mg | ORAL_CAPSULE | Freq: Every day | ORAL | Status: DC
  Administered 2019-06-10: 12.5 mg via ORAL
  Filled 2019-06-09: qty 1

## 2019-06-09 MED ORDER — PROPOFOL 10 MG/ML IV BOLUS
INTRAVENOUS | Status: AC
  Filled 2019-06-09: qty 20

## 2019-06-09 MED ORDER — PHENYLEPHRINE 40 MCG/ML (10ML) SYRINGE FOR IV PUSH (FOR BLOOD PRESSURE SUPPORT)
PREFILLED_SYRINGE | INTRAVENOUS | Status: AC
  Filled 2019-06-09: qty 10

## 2019-06-09 MED ORDER — FENTANYL CITRATE (PF) 100 MCG/2ML IJ SOLN
INTRAMUSCULAR | Status: DC | PRN
  Administered 2019-06-09 (×2): 25 ug via INTRAVENOUS
  Administered 2019-06-09 (×3): 50 ug via INTRAVENOUS

## 2019-06-09 MED ORDER — METHOCARBAMOL 500 MG PO TABS: 500.0000 mg | ORAL_TABLET | Freq: Three times a day (TID) | ORAL | 1 refills | Status: DC | PRN

## 2019-06-09 MED ORDER — CEFAZOLIN SODIUM-DEXTROSE 2-4 GM/100ML-% IV SOLN
2.0000 g | Freq: Four times a day (QID) | INTRAVENOUS | Status: AC
  Administered 2019-06-09 – 2019-06-10 (×3): 2 g via INTRAVENOUS
  Filled 2019-06-09 (×3): qty 100

## 2019-06-09 MED ORDER — ADULT MULTIVITAMIN W/MINERALS CH
1.0000 | ORAL_TABLET | Freq: Every day | ORAL | Status: DC
  Administered 2019-06-10: 1 via ORAL
  Filled 2019-06-09: qty 1

## 2019-06-09 MED ORDER — METHOCARBAMOL 500 MG PO TABS
500.0000 mg | ORAL_TABLET | Freq: Four times a day (QID) | ORAL | Status: DC | PRN
  Administered 2019-06-09 – 2019-06-10 (×3): 500 mg via ORAL
  Filled 2019-06-09 (×3): qty 1

## 2019-06-09 MED ORDER — PHENYLEPHRINE 40 MCG/ML (10ML) SYRINGE FOR IV PUSH (FOR BLOOD PRESSURE SUPPORT)
PREFILLED_SYRINGE | INTRAVENOUS | Status: DC | PRN
  Administered 2019-06-09: 120 ug via INTRAVENOUS
  Administered 2019-06-09: 80 ug via INTRAVENOUS
  Administered 2019-06-09: 120 ug via INTRAVENOUS
  Administered 2019-06-09: 80 ug via INTRAVENOUS

## 2019-06-09 MED ORDER — MENTHOL 3 MG MT LOZG: 1.0000 | LOZENGE | OROMUCOSAL | Status: DC | PRN

## 2019-06-09 MED ORDER — DEXAMETHASONE SODIUM PHOSPHATE 10 MG/ML IJ SOLN
INTRAMUSCULAR | Status: DC | PRN
  Administered 2019-06-09: 10 mg via INTRAVENOUS

## 2019-06-09 MED ORDER — OXYCODONE-ACETAMINOPHEN 5-325 MG PO TABS: 1.0000 | ORAL_TABLET | Freq: Four times a day (QID) | ORAL | 0 refills | Status: DC | PRN

## 2019-06-09 MED ORDER — BUPIVACAINE LIPOSOME 1.3 % IJ SUSP
INTRAMUSCULAR | Status: DC | PRN
  Administered 2019-06-09: 10 mL via PERINEURAL

## 2019-06-09 MED ORDER — SODIUM CHLORIDE 0.9 % IV SOLN
INTRAVENOUS | Status: DC
  Administered 2019-06-09: 12:00:00 via INTRAVENOUS

## 2019-06-09 MED ORDER — PANTOPRAZOLE SODIUM 40 MG PO TBEC
40.0000 mg | DELAYED_RELEASE_TABLET | Freq: Every day | ORAL | Status: DC
  Administered 2019-06-10: 10:00:00 40 mg via ORAL
  Filled 2019-06-09: qty 1

## 2019-06-09 MED ORDER — LIDOCAINE 2% (20 MG/ML) 5 ML SYRINGE
INTRAMUSCULAR | Status: AC
  Filled 2019-06-09: qty 5

## 2019-06-09 MED ORDER — PHENOL 1.4 % MT LIQD: 1.0000 | OROMUCOSAL | Status: DC | PRN

## 2019-06-09 MED ORDER — CHLORHEXIDINE GLUCONATE 4 % EX LIQD: 60.0000 mL | Freq: Once | CUTANEOUS | Status: DC

## 2019-06-09 MED ORDER — ALBUMIN HUMAN 5 % IV SOLN
INTRAVENOUS | Status: DC | PRN
  Administered 2019-06-09: 09:00:00 via INTRAVENOUS

## 2019-06-09 MED ORDER — FENTANYL CITRATE (PF) 100 MCG/2ML IJ SOLN: 25.0000 ug | INTRAMUSCULAR | Status: DC | PRN

## 2019-06-09 MED ORDER — DOCUSATE SODIUM 100 MG PO CAPS
100.0000 mg | ORAL_CAPSULE | Freq: Two times a day (BID) | ORAL | Status: DC
  Administered 2019-06-09 – 2019-06-10 (×2): 100 mg via ORAL
  Filled 2019-06-09 (×2): qty 1

## 2019-06-09 MED ORDER — CEFAZOLIN SODIUM-DEXTROSE 2-4 GM/100ML-% IV SOLN
2.0000 g | INTRAVENOUS | Status: AC
  Administered 2019-06-09: 2 g via INTRAVENOUS
  Filled 2019-06-09: qty 100

## 2019-06-09 MED ORDER — ONDANSETRON HCL 4 MG/2ML IJ SOLN
INTRAMUSCULAR | Status: DC | PRN
  Administered 2019-06-09: 4 mg via INTRAVENOUS

## 2019-06-09 MED ORDER — METOCLOPRAMIDE HCL 5 MG/ML IJ SOLN: 5.0000 mg | Freq: Three times a day (TID) | INTRAMUSCULAR | Status: DC | PRN

## 2019-06-09 MED ORDER — PROPOFOL 10 MG/ML IV BOLUS
INTRAVENOUS | Status: AC
  Filled 2019-06-09: qty 40

## 2019-06-09 MED ORDER — MIDAZOLAM HCL 2 MG/2ML IJ SOLN
INTRAMUSCULAR | Status: AC
  Filled 2019-06-09: qty 2

## 2019-06-09 MED ORDER — EPHEDRINE 5 MG/ML INJ
INTRAVENOUS | Status: AC
  Filled 2019-06-09: qty 10

## 2019-06-09 MED ORDER — ONDANSETRON HCL 4 MG PO TABS: 4.0000 mg | ORAL_TABLET | Freq: Four times a day (QID) | ORAL | Status: DC | PRN

## 2019-06-09 MED ORDER — ROCURONIUM BROMIDE 10 MG/ML (PF) SYRINGE
PREFILLED_SYRINGE | INTRAVENOUS | Status: DC | PRN
  Administered 2019-06-09: 10 mg via INTRAVENOUS

## 2019-06-09 MED ORDER — STERILE WATER FOR IRRIGATION IR SOLN
Status: DC | PRN
  Administered 2019-06-09 (×2): 1000 mL

## 2019-06-09 MED ORDER — TRANEXAMIC ACID-NACL 1000-0.7 MG/100ML-% IV SOLN
INTRAVENOUS | Status: AC
  Filled 2019-06-09: qty 100

## 2019-06-09 MED ORDER — GLYCOPYRROLATE PF 0.2 MG/ML IJ SOSY
PREFILLED_SYRINGE | INTRAMUSCULAR | Status: DC | PRN
  Administered 2019-06-09: .2 mg via INTRAVENOUS

## 2019-06-09 MED ORDER — DEXAMETHASONE SODIUM PHOSPHATE 10 MG/ML IJ SOLN
INTRAMUSCULAR | Status: AC
  Filled 2019-06-09: qty 1

## 2019-06-09 SURGICAL SUPPLY — 73 items
AID PSTN UNV HD RSTRNT DISP (MISCELLANEOUS) ×1
BAG SPEC THK2 15X12 ZIP CLS (MISCELLANEOUS)
BAG ZIPLOCK 12X15 (MISCELLANEOUS) IMPLANT
BIT DRILL 1.6MX128 (BIT) IMPLANT
BIT DRILL 170X2.5X (BIT) IMPLANT
BIT DRL 170X2.5X (BIT) ×1
BLADE SAG 18X100X1.27 (BLADE) ×2 IMPLANT
CLSR STERI-STRIP ANTIMIC 1/2X4 (GAUZE/BANDAGES/DRESSINGS) ×1 IMPLANT
COVER BACK TABLE 60X90IN (DRAPES) ×2 IMPLANT
COVER SURGICAL LIGHT HANDLE (MISCELLANEOUS) ×2 IMPLANT
COVER WAND RF STERILE (DRAPES) IMPLANT
CUP HUMERAL 42 PLUS 3 (Orthopedic Implant) ×1 IMPLANT
DECANTER SPIKE VIAL GLASS SM (MISCELLANEOUS) ×2 IMPLANT
DRAPE INCISE IOBAN 66X45 STRL (DRAPES) ×2 IMPLANT
DRAPE ORTHO SPLIT 77X108 STRL (DRAPES) ×4
DRAPE SHEET LG 3/4 BI-LAMINATE (DRAPES) ×2 IMPLANT
DRAPE SURG ORHT 6 SPLT 77X108 (DRAPES) ×2 IMPLANT
DRAPE U-SHAPE 47X51 STRL (DRAPES) ×2 IMPLANT
DRILL 2.5 (BIT) ×2
DRSG ADAPTIC 3X8 NADH LF (GAUZE/BANDAGES/DRESSINGS) ×2 IMPLANT
DRSG PAD ABDOMINAL 8X10 ST (GAUZE/BANDAGES/DRESSINGS) ×2 IMPLANT
DURAPREP 26ML APPLICATOR (WOUND CARE) ×2 IMPLANT
ELECT BLADE TIP CTD 4 INCH (ELECTRODE) ×2 IMPLANT
ELECT NDL TIP 2.8 STRL (NEEDLE) ×1 IMPLANT
ELECT NEEDLE TIP 2.8 STRL (NEEDLE) ×2 IMPLANT
ELECT REM PT RETURN 15FT ADLT (MISCELLANEOUS) ×2 IMPLANT
EPI LT SZ 1 (Orthopedic Implant) ×2 IMPLANT
EPIPHYSIS LT SZ 1 (Orthopedic Implant) IMPLANT
GAUZE SPONGE 4X4 12PLY STRL (GAUZE/BANDAGES/DRESSINGS) ×2 IMPLANT
GLENOSPHERE XTEND RSA 42 SD +4 (Joint) ×1 IMPLANT
GLOVE BIOGEL PI ORTHO PRO 7.5 (GLOVE) ×1
GLOVE BIOGEL PI ORTHO PRO SZ8 (GLOVE) ×1
GLOVE ORTHO TXT STRL SZ7.5 (GLOVE) ×2 IMPLANT
GLOVE PI ORTHO PRO STRL 7.5 (GLOVE) ×1 IMPLANT
GLOVE PI ORTHO PRO STRL SZ8 (GLOVE) ×1 IMPLANT
GLOVE SURG ORTHO 8.5 STRL (GLOVE) ×2 IMPLANT
GOWN STRL REUS W/TWL XL LVL3 (GOWN DISPOSABLE) ×4 IMPLANT
KIT BASIN OR (CUSTOM PROCEDURE TRAY) ×2 IMPLANT
KIT TURNOVER KIT A (KITS) IMPLANT
MANIFOLD NEPTUNE II (INSTRUMENTS) ×2 IMPLANT
METAGLENE DELTA EXTEND (Trauma) IMPLANT
METAGLENE DXTEND (Trauma) ×2 IMPLANT
NDL MAYO CATGUT SZ4 TPR NDL (NEEDLE) IMPLANT
NEEDLE MAYO CATGUT SZ4 (NEEDLE) IMPLANT
NS IRRIG 1000ML POUR BTL (IV SOLUTION) ×2 IMPLANT
PACK SHOULDER (CUSTOM PROCEDURE TRAY) ×2 IMPLANT
PIN GUIDE 1.2 (PIN) ×1 IMPLANT
PIN GUIDE GLENOPHERE 1.5MX300M (PIN) ×1 IMPLANT
PIN METAGLENE 2.5 (PIN) ×1 IMPLANT
PROTECTOR NERVE ULNAR (MISCELLANEOUS) ×2 IMPLANT
RESTRAINT HEAD UNIVERSAL NS (MISCELLANEOUS) ×2 IMPLANT
SCREW 4.5X24MM (Screw) ×2 IMPLANT
SCREW 4.5X36MM (Screw) ×1 IMPLANT
SCREW BN 24X4.5XLCK STRL (Screw) IMPLANT
SCREW LOCK 42 (Screw) ×1 IMPLANT
SLING ARM FOAM STRAP LRG (SOFTGOODS) ×1 IMPLANT
SMARTMIX MINI TOWER (MISCELLANEOUS)
SPONGE LAP 4X18 RFD (DISPOSABLE) IMPLANT
STEM 12 HA (Stem) ×1 IMPLANT
STRIP CLOSURE SKIN 1/2X4 (GAUZE/BANDAGES/DRESSINGS) ×2 IMPLANT
SUCTION FRAZIER HANDLE 10FR (MISCELLANEOUS) ×1
SUCTION TUBE FRAZIER 10FR DISP (MISCELLANEOUS) ×1 IMPLANT
SUT FIBERWIRE #2 38 T-5 BLUE (SUTURE) ×2
SUT MNCRL AB 4-0 PS2 18 (SUTURE) ×2 IMPLANT
SUT VIC AB 0 CT1 36 (SUTURE) ×4 IMPLANT
SUT VIC AB 0 CT2 27 (SUTURE) ×2 IMPLANT
SUT VIC AB 2-0 CT1 27 (SUTURE) ×4
SUT VIC AB 2-0 CT1 TAPERPNT 27 (SUTURE) ×1 IMPLANT
SUTURE FIBERWR #2 38 T-5 BLUE (SUTURE) ×1 IMPLANT
TAPE CLOTH SURG 6X10 WHT LF (GAUZE/BANDAGES/DRESSINGS) ×1 IMPLANT
TOWEL OR 17X26 10 PK STRL BLUE (TOWEL DISPOSABLE) ×2 IMPLANT
TOWER SMARTMIX MINI (MISCELLANEOUS) IMPLANT
YANKAUER SUCT BULB TIP 10FT TU (MISCELLANEOUS) ×2 IMPLANT

## 2019-06-09 NOTE — Anesthesia Postprocedure Evaluation (Signed)
Anesthesia Post Note  Patient: Ronnie Moody  Procedure(s) Performed: REVERSE SHOULDER ARTHROPLASTY (Left Shoulder)     Patient location during evaluation: PACU Anesthesia Type: Regional and General Level of consciousness: awake and alert Pain management: pain level controlled Vital Signs Assessment: post-procedure vital signs reviewed and stable Respiratory status: spontaneous breathing, nonlabored ventilation, respiratory function stable and patient connected to nasal cannula oxygen Cardiovascular status: blood pressure returned to baseline and stable Postop Assessment: no apparent nausea or vomiting Anesthetic complications: no    Last Vitals:  Vitals:   06/09/19 1040 06/09/19 1136  BP: (!) 143/74 140/82  Pulse: 70 75  Resp: 16 16  Temp: 36.9 C   SpO2: 100% 99%    Last Pain:  Vitals:   06/09/19 1132  TempSrc:   PainSc: 0-No pain                 Courtenay Creger L Betti Goodenow

## 2019-06-09 NOTE — Brief Op Note (Signed)
06/09/2019  9:31 AM  PATIENT:  Ronnie Moody  68 y.o. male  PRE-OPERATIVE DIAGNOSIS:  Left shoulder cuff arthropathy  POST-OPERATIVE DIAGNOSIS:  Left shoulder cuff arthropathy  PROCEDURE:  Procedure(s) with comments: REVERSE SHOULDER ARTHROPLASTY (Left) - with interscalene block DePuy Delta Xtend  SURGEON:  Surgeon(s) and Role:    Netta Cedars, MD - Primary  PHYSICIAN ASSISTANT:   ASSISTANTS: Ventura Bruns, PA-C   ANESTHESIA:   regional and general  EBL:  300 mL   BLOOD ADMINISTERED:none  DRAINS: none   LOCAL MEDICATIONS USED:  MARCAINE     SPECIMEN:  No Specimen  DISPOSITION OF SPECIMEN:  N/A  COUNTS:  YES  TOURNIQUET:  * No tourniquets in log *  DICTATION: .Other Dictation: Dictation Number [id number]  PLAN OF CARE: Admit to inpatient   PATIENT DISPOSITION:  PACU - hemodynamically stable.   Delay start of Pharmacological VTE agent (>24hrs) due to surgical blood loss or risk of bleeding: no

## 2019-06-09 NOTE — Anesthesia Preprocedure Evaluation (Addendum)
Anesthesia Evaluation  Patient identified by MRN, date of birth, ID band Patient awake    Reviewed: Allergy & Precautions, NPO status , Patient's Chart, lab work & pertinent test results, reviewed documented beta blocker date and time   Airway Mallampati: II  TM Distance: >3 FB Neck ROM: Full    Dental no notable dental hx. (+) Teeth Intact, Dental Advisory Given   Pulmonary neg pulmonary ROS, former smoker,    Pulmonary exam normal breath sounds clear to auscultation       Cardiovascular hypertension, Pt. on medications and Pt. on home beta blockers Normal cardiovascular exam Rhythm:Regular Rate:Normal  HLD   Neuro/Psych negative neurological ROS  negative psych ROS   GI/Hepatic Neg liver ROS, GERD  Medicated,  Endo/Other  negative endocrine ROS  Renal/GU negative Renal ROS  negative genitourinary   Musculoskeletal  (+) Arthritis ,   Abdominal   Peds  Hematology negative hematology ROS (+)   Anesthesia Other Findings   Reproductive/Obstetrics                            Anesthesia Physical Anesthesia Plan  ASA: II  Anesthesia Plan: General and Regional   Post-op Pain Management:  Regional for Post-op pain   Induction: Intravenous  PONV Risk Score and Plan: 2 and Midazolam, Dexamethasone and Ondansetron  Airway Management Planned: Oral ETT  Additional Equipment:   Intra-op Plan:   Post-operative Plan: Extubation in OR  Informed Consent: I have reviewed the patients History and Physical, chart, labs and discussed the procedure including the risks, benefits and alternatives for the proposed anesthesia with the patient or authorized representative who has indicated his/her understanding and acceptance.     Dental advisory given  Plan Discussed with: CRNA  Anesthesia Plan Comments:         Anesthesia Quick Evaluation

## 2019-06-09 NOTE — Interval H&P Note (Signed)
History and Physical Interval Note:  06/09/2019 7:30 AM  Ronnie Moody  has presented today for surgery, with the diagnosis of Left shoulder cuff arthropathy.  The various methods of treatment have been discussed with the patient and family. After consideration of risks, benefits and other options for treatment, the patient has consented to  Procedure(s) with comments: REVERSE SHOULDER ARTHROPLASTY (Left) - with interscalene block as a surgical intervention.  The patient's history has been reviewed, patient examined, no change in status, stable for surgery.  I have reviewed the patient's chart and labs.  Questions were answered to the patient's satisfaction.     [name]

## 2019-06-09 NOTE — Discharge Instructions (Signed)
Ice to the shoulder constantly.  Keep the incision covered and clean and dry for one week, then ok to get it wet in the shower. ° °Do exercise as instructed several times per day. ° °DO NOT reach behind your back or push up out of a chair with the operative arm. ° °Use a sling while you are up and around for comfort, may remove while seated.  Keep pillow propped behind the operative elbow. ° °Follow up with Dr Yolani Vo in two weeks in the office, call [phone] for appt °

## 2019-06-09 NOTE — Op Note (Signed)
NAME: Ronnie Moody, Ronnie M. MEDICAL RECORD [id number] ACCOUNT [id number] DATE OF BIRTH:[birth year] FACILITY: [facility] LOCATION: [facility] PHYSICIAN:STEVEN Russ Halo. 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  PROCEDURE PERFORMED:  Left reverse total shoulder replacement using DePuy Delta Xtend prosthesis.  ATTENDING SURGEON:  Beverely LowSteve Vaishnavi Dalby, MD  ASSISTANT:  Modesto Charonhomas Brad Dixon, [place], who was scrubbed during the entire procedure and necessary for satisfactory completion of surgery.  ANESTHESIA:  General anesthesia was used plus interscalene block.  ESTIMATED BLOOD LOSS:  300 mL.  FLUID REPLACEMENT:  1500 mL crystalloid, 500 mL albumin.  INDICATIONS:  The patient is a 68 year old male with a history of progressively worsening left shoulder pain and dysfunction secondary to end-stage rotator cuff tear arthropathy.  Despite conservative measures, the patient has had progressive pain and  has rest pain and night pain.  The patient presents now for operative shoulder arthroplasty to restore function and eliminate pain in the shoulder.  Informed consent obtained.  DESCRIPTION OF PROCEDURE:  After an adequate level of anesthesia was achieved, the patient was positioned in the modified beach chair position.  Left shoulder correctly identified and sterilely prepped and draped in the usual manner.  Timeout was called,  verifying correct patient, correct site.  We entered the patient's shoulder using a standard deltopectoral approach starting at the coracoid process and extending down to the anterior humerus, dissection down through the subcutaneous tissues to the  cephalic vein, which was taken laterally with the deltoid pectoralis taken medially.  Conjoined tendon identified and retracted medially.  We tenodesed the biceps in situ with 0  Vicryl figure-of-eight suture x2.  Next, we went ahead and released the  subscapularis remnant.  This was extremely thin and atrophied, but we released the remnant off the lesser tuberosity and tagged for protection of the axillary nerve, which was easily palpable.  We then released the inferior capsule, progressively  externally rotating.  We delivered the humeral head out of the wound.  We entered the proximal humerus with a 6 mm reamer, reaming up to a size 12.  We then used the 12 mm intramedullary resection guide to resect the head at 10 degrees of retroversion.   We removed excess osteophytes with a rongeur.  We subluxed the humerus posteriorly and removed capsular labral tissues, getting 360-degree exposure of the glenoid face, which was extremely retroverted.  Next, we placed our retractors.  We had good  visualization.  We placed our center guide pin, angling a little inferiorly and centered low on the glenoid.  Again, this was placed in a manner such to correct the retroversion.  We reamed the high side anterior side down.  We still had plenty of room  for the metaglene baseplate, and we had good bone preparation.  We did our peripheral hand reaming, drilled our central peg hole, and impacted the metaglene in place.  We had good bony support.  We placed a 42 lock screw inferiorly, a 36 lock screw  superiorly, and a 24 lock screw anteriorly.  We did not place a screw posteriorly.  The very back part of the metaglene baseplate was off the bone slightly, but again we had excellent bony support for our metaglene.  We selected a 42+4 as we had reamed  down the high side.  There was already some medialization, so we had to offset and then screwed that  into position on the baseplate.  We were happy with the positioning.  We did a finger sweep to make sure that no soft tissue was incorporated in that  bearing area.  We irrigated again.  We then finished our preparation on the humerus with an Epi-1 left  metaphyseal component.  We reamed for that and then used the 12 stem with the Epi-1 left set on zero setting and placed in 10 degrees of retroversion.   We had good bony support for the implant.  Reduced the shoulder with a 42+3 poly trial and were happy with the soft tissue balancing.  We had a nice little click when it popped into place, nice and stable.  Conjoined tendon appropriately tensioned but  not too much.  Removed the trial components from the humeral side, irrigating thoroughly.  Selected the HA-coated press-fit components with a 12 stem and the Epi-1 left set on the zero setting.  We impacted that into 10 degrees of retroversion with some  available bone graft from the head.  We had great fixation of the implant.  We selected the real 42+3 poly, impacted that in place, and reduced the shoulder. Again, a nice little pop when it seated and no gapping with inferior pole or external rotation  and nice conjoined tensioned.  Axillary nerve was palpated, in good shape.  We irrigated thoroughly.  We resected the subscap remnant and repaired the deltopectoral interval with 0 Vicryl suture, followed by 2-0 Vicryl for subcutaneous closure and 4-0  Monocryl for skin.  Steri-Strips applied, followed by a sterile dressing.  The patient tolerated surgery well.  LN/NUANCE  D:06/09/2019 T:06/09/2019 JOB:[id number]

## 2019-06-09 NOTE — Transfer of Care (Signed)
Immediate Anesthesia Transfer of Care Note  Patient: Ronnie Moody  Procedure(s) Performed: REVERSE SHOULDER ARTHROPLASTY (Left Shoulder)  Patient Location: PACU  Anesthesia Type:GA combined with regional for post-op pain  Level of Consciousness: awake, oriented and patient cooperative  Airway & Oxygen Therapy: Patient Spontanous Breathing and Patient connected to face mask oxygen  Post-op Assessment: Report given to RN and Post -op Vital signs reviewed and stable  Post vital signs: Reviewed and stable  Last Vitals:  Vitals Value Taken Time  BP 156/82 06/09/19 0938  Temp    Pulse 76 06/09/19 0939  Resp 13 06/09/19 0939  SpO2 100 % 06/09/19 0939  Vitals shown include unvalidated device data.  Last Pain:  Vitals:   06/09/19 0620  TempSrc:   PainSc: 0-No pain         Complications: No apparent anesthesia complications

## 2019-06-09 NOTE — Anesthesia Procedure Notes (Signed)
Anesthesia Regional Block: Interscalene brachial plexus block   Pre-Anesthetic Checklist: ,, timeout performed, Correct Patient, Correct Site, Correct Laterality, Correct Procedure, Correct Position, site marked, Risks and benefits discussed,  Surgical consent,  Pre-op evaluation,  At surgeon's request and post-op pain management  Laterality: Left  Prep: Maximum Sterile Barrier Precautions used, chloraprep       Needles:  Injection technique: Single-shot  Needle Type: Echogenic Stimulator Needle     Needle Length: 9cm  Needle Gauge: 22     Additional Needles:   Procedures:,,,, ultrasound used (permanent image in chart),,,,  Narrative:  Start time: 06/09/2019 7:05 AM End time: 06/09/2019 7:15 AM Injection made incrementally with aspirations every 5 mL.  Performed by: Personally  Anesthesiologist: Freddrick March, MD  Additional Notes: Monitors applied. No increased pain on injection. No increased resistance to injection. Injection made in 5cc increments. Good needle visualization. Patient tolerated procedure well.

## 2019-06-09 NOTE — Anesthesia Procedure Notes (Signed)
Procedure Name: Intubation Date/Time: 06/09/2019 7:49 AM Performed by: Silas Sacramento, CRNA Pre-anesthesia Checklist: Patient identified, Emergency Drugs available, Suction available and Patient being monitored Patient Re-evaluated:Patient Re-evaluated prior to induction Oxygen Delivery Method: Circle system utilized Preoxygenation: Pre-oxygenation with 100% oxygen Induction Type: IV induction and Rapid sequence Ventilation: Mask ventilation without difficulty Laryngoscope Size: Mac and 4 Grade View: Grade I Tube type: Oral Tube size: 7.5 mm Number of attempts: 1 Airway Equipment and Method: Stylet and Oral airway Placement Confirmation: ETT inserted through vocal cords under direct vision,  positive ETCO2 and breath sounds checked- equal and bilateral Secured at: 22 cm Tube secured with: Tape Dental Injury: Teeth and Oropharynx as per pre-operative assessment

## 2019-06-10 LAB — BASIC METABOLIC PANEL
Anion gap: 9 (ref 5–15)
BUN: 14 mg/dL (ref 8–23)
CO2: 25 mmol/L (ref 22–32)
Calcium: 8.7 mg/dL — ABNORMAL LOW (ref 8.9–10.3)
Chloride: 96 mmol/L — ABNORMAL LOW (ref 98–111)
Creatinine, Ser: 0.77 mg/dL (ref 0.61–1.24)
GFR calc Af Amer: >60 mL/min (ref >60)
GFR calc non Af Amer: >60 mL/min (ref >60)
Glucose, Bld: 174 mg/dL — ABNORMAL HIGH (ref 70–99)
Potassium: 3.9 mmol/L (ref 3.5–5.1)
Sodium: 130 mmol/L — ABNORMAL LOW (ref 135–145)

## 2019-06-10 LAB — HEMOGLOBIN AND HEMATOCRIT, BLOOD
HCT: 34.9 % — ABNORMAL LOW (ref 39.0–52.0)
Hemoglobin: 12.1 g/dL — ABNORMAL LOW (ref 13.0–17.0)

## 2019-06-10 NOTE — Discharge Summary (Signed)
PATIENT ID:      Ronnie Moody  MRN:     [id number] DOB/AGE:    02/04/1951 / 68 y.o.     DISCHARGE SUMMARY  ADMISSION DATE:    06/09/2019 DISCHARGE DATE:    ADMISSION DIAGNOSIS: Left shoulder cuff arthropathy Past Medical History:  Diagnosis Date  . Arthritis   . GERD (gastroesophageal reflux disease)   . Hypertension   . Pre-diabetes     DISCHARGE DIAGNOSIS:   Active Problems:   H/O total shoulder replacement, left   PROCEDURE: Procedure(s): REVERSE SHOULDER ARTHROPLASTY on 06/09/2019  CONSULTS:    HISTORY:  See H&P in chart.  HOSPITAL COURSE:  Ronnie Moody is a 68 y.o. admitted on 06/09/2019 with a diagnosis of Left shoulder cuff arthropathy.  They were brought to the operating room on 06/09/2019 and underwent Procedure(s): REVERSE SHOULDER ARTHROPLASTY.    They were given perioperative antibiotics:  Anti-infectives (From admission, onward)   Start     Dose/Rate Route Frequency Ordered Stop   06/09/19 1400  ceFAZolin (ANCEF) IVPB 2g/100 mL premix     2 g 200 mL/hr over 30 Minutes Intravenous Every 6 hours 06/09/19 1035 06/10/19 0133   06/09/19 0600  ceFAZolin (ANCEF) IVPB 2g/100 mL premix     2 g 200 mL/hr over 30 Minutes Intravenous On call to O.R. 06/09/19 [id number] 06/09/19 0759    .  Patient underwent the above named procedure and tolerated it well. The following day they were hemodynamically stable and pain was controlled on oral analgesics. They were neurovascularly intact to the operative extremity. He had moderate bloody drainage noted to dressing so dressing change was performed prior to discharge. OT was ordered and worked with patient per protocol. They were medically and orthopaedically stable for discharge on .    DIAGNOSTIC STUDIES:  RECENT RADIOGRAPHIC STUDIES :  Dg Shoulder Left Port  Result Date: 06/09/2019 CLINICAL DATA:  Status post left shoulder arthroplasty. EXAM: LEFT SHOULDER - 1 VIEW COMPARISON:  None. FINDINGS: The right glenoid and humeral  components appear to be well situated. Expected postoperative changes are seen in the surrounding soft tissues. No dislocation is noted. IMPRESSION: Status post left total shoulder arthroplasty. Electronically Signed   By: Lupita RaiderJames  Green Jr M.D.   On: 06/09/2019 11:37    RECENT VITAL SIGNS:   Patient Vitals for the past 24 hrs:  BP Temp Temp src Pulse Resp SpO2  06/10/19 0432 (!) 142/76 98.5 F (36.9 C) Oral 76 16 99 %  06/10/19 0058 (!) 157/78 98.3 F (36.8 C) Oral 74 16 97 %  06/09/19 2124 (!) 148/95 98.1 F (36.7 C) Oral 86 16 96 %  06/09/19 1714 (!) 169/89 97.9 F (36.6 C) Oral 72 16 96 %  06/09/19 1355 136/80 - - 75 16 98 %  06/09/19 1247 (!) 107/92 - - 78 16 98 %  06/09/19 1136 140/82 - - 75 16 99 %  06/09/19 1040 (!) 143/74 98.4 F (36.9 C) Oral 70 16 100 %  06/09/19 1015 (!) 144/76 - - 70 14 100 %  06/09/19 1000 135/72 98.7 F (37.1 C) - 72 17 100 %  06/09/19 0945 (!) 149/84 - - 75 15 100 %  06/09/19 0938 (!) 156/82 98.7 F (37.1 C) - 79 13 100 %  .  RECENT EKG RESULTS:   No orders found for this or any previous visit.  DISCHARGE INSTRUCTIONS:    DISCHARGE MEDICATIONS:   Allergies as of 06/10/2019   No Known Allergies  Medication List    TAKE these medications   Fish Oil 1200 MG Caps Take 1,200 mg by mouth daily.   fluticasone 50 MCG/ACT nasal spray Commonly known as: FLONASE Place 1 spray into both nostrils as needed for allergies or rhinitis.   hydrochlorothiazide 12.5 MG capsule Commonly known as: MICROZIDE Take 12.5 mg by mouth daily.   lisinopril 10 MG tablet Commonly known as: ZESTRIL Take 10 mg by mouth daily.   methocarbamol 500 MG tablet Commonly known as: Robaxin Take 1 tablet (500 mg total) by mouth 3 (three) times daily as needed.   metoprolol succinate 100 MG 24 hr tablet Commonly known as: TOPROL-XL Take 100 mg by mouth daily.   multivitamin tablet Take 1 tablet by mouth daily.   omeprazole 20 MG capsule Commonly known as:  PRILOSEC Take 20 mg by mouth daily.   oxyCODONE-acetaminophen 5-325 MG tablet Commonly known as: Percocet Take 1 tablet by mouth every 6 (six) hours as needed for severe pain.       FOLLOW UP VISIT:   Follow-up Information    Netta Cedars, MD. Call in 2 weeks.   Specialty: Orthopedic Surgery Why: [phone] Contact information: [address] [place] [address] [phone]           DISCHARGE TO: Home   DISCHARGE CONDITION:  Ronnie Moody , PA-C 06/10/2019, 9:01 AM

## 2019-06-10 NOTE — Progress Notes (Signed)
Occupational Therapy Treatment Patient Details Name: Ronnie KeenJohn M Moody MRN: [id number] DOB: [birth year] Today's Date: 06/10/2019    History of present illness Left- REVERSE SHOULDER ARTHROPLASTY per Dr Ranell PatrickNorris   OT comments  OT eval and education complete.  Handout provided. Daughter will A as needed  Follow Up Recommendations  Supervision - Intermittent    Equipment Recommendations  None recommended by OT       Precautions / Restrictions Precautions Precautions: Shoulder Type of Shoulder Precautions: sling for comfort Shoulder Interventions: For comfort Precaution Comments: elbow wrist and hand ok. AAROM  FF 0-90, Abduction 0-60, ER 0-30 Required Braces or Orthoses: Sling Restrictions Weight Bearing Restrictions: Yes LUE Weight Bearing: Non weight bearing       Mobility Bed Mobility Overal bed mobility: Independent                Transfers Overall transfer level: Independent                        ADL either performed or assessed with clinical judgement        Vision Patient Visual Report: No change from baseline            Cognition Arousal/Alertness: Awake/alert Behavior During Therapy: WFL for tasks assessed/performed Overall Cognitive Status: Within Functional Limits for tasks assessed                                             Shoulder Instructions Shoulder Instructions Donning/doffing shirt without moving shoulder: Minimal assistance;Patient able to independently direct caregiver Method for sponge bathing under operated UE: Minimal assistance;Patient able to independently direct caregiver Donning/doffing sling/immobilizer: Minimal assistance;Patient able to independently direct caregiver Correct positioning of sling/immobilizer: Minimal assistance;Patient able to independently direct caregiver Pendulum exercises (written home exercise program): Minimal assistance;Patient able to independently direct caregiver ROM for  elbow, wrist and digits of operated UE: Minimal assistance;Patient able to independently direct caregiver Sling wearing schedule (on at all times/off for ADL's): Minimal assistance;Patient able to independently direct caregiver Proper positioning of operated UE when showering: Supervision/safety Positioning of UE while sleeping: Minimal assistance;Patient able to independently direct caregiver            Home Living Family/patient expects to be discharged to:: Private residence Living Arrangements: Children;Other relatives                                              AM-PAC OT "6 Clicks" Daily Activity     Outcome Measure   Help from another person eating meals?: None Help from another person taking care of personal grooming?: None Help from another person toileting, which includes using toliet, bedpan, or urinal?: None Help from another person bathing (including washing, rinsing, drying)?: A Little Help from another person to put on and taking off regular upper body clothing?: A Little Help from another person to put on and taking off regular lower body clothing?: A Little 6 Click Score: 21    End of Session Equipment Utilized During Treatment: Other (comment)(sling)      Activity Tolerance Patient tolerated treatment well   Patient Left in chair;with call bell/phone within reach   Nurse Communication Mobility status        Time: [id number] OT Time  Calculation (min): 25 min  Charges: OT General Charges $OT Visit: 1 Visit OT Evaluation $OT Eval Low Complexity: 1 Low  Kari Baars, OT Acute Rehabilitation Services [phone] Office- [phone], Edwena Felty D 06/10/2019, 10:49 AM

## 2019-06-10 NOTE — Discharge Summary (Signed)
Orthopedic Discharge Summary        Physician Discharge Summary  Patient ID: Ronnie Moody MRN: [id number] DOB/AGE: [birth year] 68 y.o.  Admit date: 06/09/2019 Discharge date: 06/10/2019   Procedures:  Procedure(s) (LRB): REVERSE SHOULDER ARTHROPLASTY (Left)  Attending Physician:  Dr. Esmond Plants  Admission Diagnoses:   Left shoulder end stage OA, rotator cuff insufficiency  Discharge Diagnoses:  same   Past Medical History:  Diagnosis Date  . Arthritis   . GERD (gastroesophageal reflux disease)   . Hypertension   . Pre-diabetes     PCP: Hulan Fess, MD   Discharged Condition: good  Hospital Course:  Patient underwent the above stated procedure on 06/09/2019. Patient tolerated the procedure well and brought to the recovery room in good condition and subsequently to the floor. Patient had an uncomplicated hospital course and was stable for discharge.   Disposition: Discharge disposition: 01-Home or Self Care      with follow up in 2 weeks   Follow-up Information    Netta Cedars, MD. Call in 2 weeks.   Specialty: Orthopedic Surgery Why: [phone] Contact information: [address] [place] [address] [phone]             Allergies as of 06/10/2019   No Known Allergies     Medication List    TAKE these medications   Fish Oil 1200 MG Caps Take 1,200 mg by mouth daily.   fluticasone 50 MCG/ACT nasal spray Commonly known as: FLONASE Place 1 spray into both nostrils as needed for allergies or rhinitis.   hydrochlorothiazide 12.5 MG capsule Commonly known as: MICROZIDE Take 12.5 mg by mouth daily.   lisinopril 10 MG tablet Commonly known as: ZESTRIL Take 10 mg by mouth daily.   methocarbamol 500 MG tablet Commonly known as: Robaxin Take 1 tablet (500 mg total) by mouth 3 (three) times daily as needed.   metoprolol succinate 100 MG 24 hr tablet Commonly known as: TOPROL-XL Take 100 mg by mouth daily.    multivitamin tablet Take 1 tablet by mouth daily.   omeprazole 20 MG capsule Commonly known as: PRILOSEC Take 20 mg by mouth daily.   oxyCODONE-acetaminophen 5-325 MG tablet Commonly known as: Percocet Take 1 tablet by mouth every 6 (six) hours as needed for severe pain.         Signed: [name] 06/10/2019, 10:32 AM  [facility] is now [employer] [address] [place], [place], [place] [address] Phone: [place]

## 2019-06-10 NOTE — Progress Notes (Signed)
Nurse reviewed discharge instructions with pt. Pt verbalized understanding of discharge instructions, follow up appointment and new medications.  No concerns at time of discharge. 

## 2019-06-12 ENCOUNTER — Encounter (HOSPITAL_COMMUNITY): Payer: Self-pay | Admitting: Orthopedic Surgery

## 2019-06-22 DIAGNOSIS — Z96612 Presence of left artificial shoulder joint: Secondary | ICD-10-CM | POA: Diagnosis not present

## 2019-06-22 DIAGNOSIS — Z471 Aftercare following joint replacement surgery: Secondary | ICD-10-CM | POA: Diagnosis not present

## 2019-07-20 DIAGNOSIS — Z471 Aftercare following joint replacement surgery: Secondary | ICD-10-CM | POA: Diagnosis not present

## 2019-07-20 DIAGNOSIS — Z96612 Presence of left artificial shoulder joint: Secondary | ICD-10-CM | POA: Diagnosis not present

## 2019-07-26 ENCOUNTER — Other Ambulatory Visit: Payer: Self-pay

## 2019-07-26 DIAGNOSIS — Z20822 Contact with and (suspected) exposure to covid-19: Secondary | ICD-10-CM

## 2019-07-28 LAB — NOVEL CORONAVIRUS, NAA: SARS-CoV-2, NAA: NOT DETECTED

## 2019-08-08 ENCOUNTER — Other Ambulatory Visit: Payer: Self-pay

## 2019-08-08 DIAGNOSIS — Z20822 Contact with and (suspected) exposure to covid-19: Secondary | ICD-10-CM

## 2019-08-10 LAB — NOVEL CORONAVIRUS, NAA: SARS-CoV-2, NAA: NOT DETECTED

## 2019-08-22 NOTE — H&P (Signed)
Patient's anticipated LOS is less than 2 midnights, meeting these requirements: - Younger than 31 - Lives within 1 hour of care - Has a competent adult at home to recover with post-op recover - NO history of  - Chronic pain requiring opiods  - Diabetes  - Coronary Artery Disease  - Heart failure  - Heart attack  - Stroke  - DVT/VTE  - Cardiac arrhythmia  - Respiratory Failure/COPD  - Renal failure  - Anemia  - Advanced Liver disease       Ronnie Moody is an 68 y.o. male.    Chief Complaint: right shoulder pain  HPI: Pt is a 68 y.o. male complaining of right shoulder pain for multiple years. Pain had continually increased since the beginning. X-rays in the clinic show end-stage arthritic changes of the right shoulder. Pt has tried various conservative treatments which have failed to alleviate their symptoms, including injections and therapy. Various options are discussed with the patient. Risks, benefits and expectations were discussed with the patient. Patient understand the risks, benefits and expectations and wishes to proceed with surgery.   PCP:  Hulan Fess, MD  D/C Plans: Home  PMH: Past Medical History:  Diagnosis Date  . Arthritis   . GERD (gastroesophageal reflux disease)   . Hypertension   . Pre-diabetes     PSH: Past Surgical History:  Procedure Laterality Date  . COLONOSCOPY    . FOOT SURGERY Bilateral    Neuromas  . REVERSE SHOULDER ARTHROPLASTY Left 06/09/2019   Procedure: REVERSE SHOULDER ARTHROPLASTY;  Surgeon: Netta Cedars, MD;  Location: [facility];  Service: Orthopedics;  Laterality: Left;  with interscalene block  . UPPER GI ENDOSCOPY      Social History:  reports that he has quit smoking. His smoking use included cigarettes. He has never used smokeless tobacco. He reports current alcohol use. He reports previous drug use.  Allergies:  No Known Allergies  Medications: No current facility-administered medications for this encounter.     Current Outpatient Medications  Medication Sig Dispense Refill  . fluticasone (FLONASE) 50 MCG/ACT nasal spray Place 1 spray into both nostrils as needed for allergies or rhinitis.    . hydrochlorothiazide (MICROZIDE) 12.5 MG capsule Take 12.5 mg by mouth daily.    Marland Kitchen lisinopril (ZESTRIL) 10 MG tablet Take 10 mg by mouth daily.    . methocarbamol (ROBAXIN) 500 MG tablet Take 1 tablet (500 mg total) by mouth 3 (three) times daily as needed. 40 tablet 1  . metoprolol succinate (TOPROL-XL) 100 MG 24 hr tablet Take 100 mg by mouth daily.    . Multiple Vitamin (MULTIVITAMIN) tablet Take 1 tablet by mouth daily.    . Omega-3 Fatty Acids (FISH OIL) 1200 MG CAPS Take 1,200 mg by mouth daily.    Marland Kitchen omeprazole (PRILOSEC) 20 MG capsule Take 20 mg by mouth daily.    Marland Kitchen oxyCODONE-acetaminophen (PERCOCET) 5-325 MG tablet Take 1 tablet by mouth every 6 (six) hours as needed for severe pain. 30 tablet 0    No results found for this or any previous visit (from the past 48 hour(s)). No results found.  ROS: Pain with rom of the right upper extremity  Physical Exam: Alert and oriented 68 y.o. male in no acute distress Cranial nerves 2-12 intact Cervical spine: full rom with no tenderness, nv intact distally Chest: active breath sounds bilaterally, no wheeze rhonchi or rales Heart: regular rate and rhythm, no murmur Abd: non tender non distended with active bowel sounds Hip  is stable with rom  Right shoulder painful rom Weakness with ER and IR No rashes or edema distally  Assessment/Plan Assessment: right shoulder cuff arthropathy  Plan:  Patient will undergo a right reverse total shoulder by [facility] Ranell Patrick [facility]. Risks benefits and expectations were discussed with the patient. Patient understand risks, benefits and expectations and wishes to proceed. Preoperative templating of the joint replacement has been completed, documented, and submitted to the Operating Room personnel in order to  optimize intra-operative equipment management.   Alphonsa Overall PA-C, MPAS [facility] is now [employer] [address], [place], [place] [address] Phone: [phone] [email or web address] Facebook  [employer]

## 2019-09-08 NOTE — Patient Instructions (Addendum)
DUE TO COVID-19 ONLY ONE VISITOR IS ALLOWED TO COME WITH YOU AND STAY IN THE WAITING ROOM ONLY DURING PRE OP AND PROCEDURE DAY OF SURGERY. THE 1 VISITOR MAY VISIT WITH YOU AFTER SURGERY IN YOUR PRIVATE ROOM DURING VISITING HOURS ONLY!  YOU NEED TO HAVE A COVID 19 TEST ON___12-15-2020___ :20am_____, THIS TEST MUST BE DONE BEFORE SURGERY, COME  [address], [place]  , [address].  [facility]) ONCE YOUR COVID TEST IS COMPLETED, PLEASE BEGIN THE QUARANTINE INSTRUCTIONS AS OUTLINED IN YOUR HANDOUT.                Kandee Keen    Your procedure is scheduled on: 12-18   Report to [facility] Main  Entrance    Report to SHORT STAY at 5:30AM     Call this number if you have problems the morning of surgery [phone]    NO SOLID FOOD AFTER MIDNIGHT THE NIGHT PRIOR TO SURGERY. YOU MAY DRINK CLEAR LIQUIDS.   STOP CLEAR LIQUIDS AT ____4:30AM_____ AND THEN DRINK THE GATORADE PRE-SURGERY DRINK.   NOTHING BY MOUTH AFTER THE GATORADE DRINK!    CLEAR LIQUID DIET   Foods Allowed                                                                     Foods Excluded  Coffee and tea, regular and decaf                             liquids that you cannot  Plain Jell-O any favor except red or purple             see through such as: Fruit ices (not with fruit pulp)                                     milk, soups, orange juice  Iced Popsicles                                    All solid food Carbonated beverages, regular and diet                                    Cranberry, grape and apple juices Sports drinks like Gatorade Lightly seasoned clear broth or consume(fat free) Sugar, honey syrup  Sample Menu Breakfast                                Lunch                                     Supper Cranberry juice                    Beef broth  Chicken broth Jell-O                                     Grape juice                           Apple  juice Coffee or tea                        Jell-O                                      Popsicle                                                Coffee or tea                        Coffee or tea  _____________________________________________________________________   BRUSH YOUR TEETH MORNING OF SURGERY AND RINSE YOUR MOUTH OUT, NO CHEWING GUM CANDY OR MINTS.     Take these medicines the morning of surgery with A SIP OF WATER: METOPROLOL, OMEPRAZOLE, FLONASE IF NEEDED                                 You may not have any metal on your body including hair pins and              piercings  Do not wear jewelry, make-up, lotions, powders or perfumes, deodorant                       Men may shave face and neck.   Do not bring valuables to the hospital. [facility] IS NOT             RESPONSIBLE   FOR VALUABLES.  Contacts, dentures or bridgework may not be worn into surgery.  YOU MAY BRING A SMALL OVERNIGHT BAG               Please read over the following fact sheets you were given: _____________________________________________________________________             [facility] - Preparing for Surgery Before surgery, you can play an important role.  Because skin is not sterile, your skin needs to be as free of germs as possible.  You can reduce the number of germs on your skin by washing with CHG (chlorahexidine gluconate) soap before surgery.  CHG is an antiseptic cleaner which kills germs and bonds with the skin to continue killing germs even after washing. Please DO NOT use if you have an allergy to CHG or antibacterial soaps.  If your skin becomes reddened/irritated stop using the CHG and inform your nurse when you arrive at Short Stay. Do not shave (including legs and underarms) for at least 48 hours prior to the first CHG shower.  You may shave your face/neck. Please follow these instructions carefully:  1.  Shower with CHG Soap the night before surgery and the  morning of Surgery.  2.   If you choose to wash your hair,  wash your hair first as usual with your  normal  shampoo.  3.  After you shampoo, rinse your hair and body thoroughly to remove the  shampoo.                            4.  Use CHG as you would any other liquid soap.  You can apply chg directly  to the skin and wash                       Gently with a scrungie or clean washcloth.  5.  Apply the CHG Soap to your body ONLY FROM THE NECK DOWN.   Do not use on face/ open                           Wound or open sores. Avoid contact with eyes, ears mouth and genitals (private parts).                       Wash face,  Genitals (private parts) with your normal soap.             6.  Wash thoroughly, paying special attention to the area where your surgery  will be performed.  7.  Thoroughly rinse your body with warm water from the neck down.  8.  DO NOT shower/wash with your normal soap after using and rinsing off  the CHG Soap.                9.  Pat yourself dry with a clean towel.            10.  Wear clean pajamas.            11.  Place clean sheets on your bed the night of your first shower and do not  sleep with pets. Day of Surgery : Do not apply any lotions/deodorants the morning of surgery.  Please wear clean clothes to the hospital/surgery center.  FAILURE TO FOLLOW THESE INSTRUCTIONS MAY RESULT IN THE CANCELLATION OF YOUR SURGERY PATIENT SIGNATURE_________________________________  NURSE SIGNATURE__________________________________  ________________________________________________________________________   Rogelia MireIncentive Spirometer  An incentive spirometer is a tool that can help keep your lungs clear and active. This tool measures how well you are filling your lungs with each breath. Taking long deep breaths may help reverse or decrease the chance of developing breathing (pulmonary) problems (especially infection) following:  A long period of time when you are unable to move or be active. BEFORE THE PROCEDURE    If the spirometer includes an indicator to show your best effort, your nurse or respiratory therapist will set it to a desired goal.  If possible, sit up straight or lean slightly forward. Try not to slouch.  Hold the incentive spirometer in an upright position. INSTRUCTIONS FOR USE  1. Sit on the edge of your bed if possible, or sit up as far as you can in bed or on a chair. 2. Hold the incentive spirometer in an upright position. 3. Breathe out normally. 4. Place the mouthpiece in your mouth and seal your lips tightly around it. 5. Breathe in slowly and as deeply as possible, raising the piston or the ball toward the top of the column. 6. Hold your breath for 3-5 seconds or for as long as possible. Allow the piston or ball to fall to the bottom of the  column. 7. Remove the mouthpiece from your mouth and breathe out normally. 8. Rest for a few seconds and repeat Steps 1 through 7 at least 10 times every 1-2 hours when you are awake. Take your time and take a few normal breaths between deep breaths. 9. The spirometer may include an indicator to show your best effort. Use the indicator as a goal to work toward during each repetition. 10. After each set of 10 deep breaths, practice coughing to be sure your lungs are clear. If you have an incision (the cut made at the time of surgery), support your incision when coughing by placing a pillow or rolled up towels firmly against it. Once you are able to get out of bed, walk around indoors and cough well. You may stop using the incentive spirometer when instructed by your caregiver.  RISKS AND COMPLICATIONS  Take your time so you do not get dizzy or light-headed.  If you are in pain, you may need to take or ask for pain medication before doing incentive spirometry. It is harder to take a deep breath if you are having pain. AFTER USE  Rest and breathe slowly and easily.  It can be helpful to keep track of a log of your progress. Your caregiver  can provide you with a simple table to help with this. If you are using the spirometer at home, follow these instructions: [place] IF:   You are having difficultly using the spirometer.  You have trouble using the spirometer as often as instructed.  Your pain medication is not giving enough relief while using the spirometer.  You develop fever of 100.5 F (38.1 C) or higher. SEEK IMMEDIATE MEDICAL CARE IF:   You cough up bloody sputum that had not been present before.  You develop fever of 102 F (38.9 C) or greater.  You develop worsening pain at or near the incision site. MAKE SURE YOU:   Understand these instructions.  Will watch your condition.  Will get help right away if you are not doing well or get worse. Document Released: 01/25/2007 Document Revised: 12/07/2011 Document Reviewed: 03/28/2007 [facility] Patient Information 2014 [place], [place].   ________________________________________________________________________

## 2019-09-12 ENCOUNTER — Encounter (HOSPITAL_COMMUNITY)
Admission: RE | Admit: 2019-09-12 | Discharge: 2019-09-12 | Disposition: A | Discharge status: Home / Self Care | Payer: BC Managed Care – PPO | Source: Ambulatory Visit | Attending: Orthopedic Surgery | Admitting: Orthopedic Surgery

## 2019-09-12 ENCOUNTER — Encounter (HOSPITAL_COMMUNITY): Payer: Self-pay

## 2019-09-12 ENCOUNTER — Other Ambulatory Visit: Payer: Self-pay

## 2019-09-12 ENCOUNTER — Other Ambulatory Visit (HOSPITAL_COMMUNITY)
Admission: RE | Admit: 2019-09-12 | Discharge: 2019-09-12 | Disposition: A | Discharge status: Home / Self Care | Payer: BC Managed Care – PPO | Source: Ambulatory Visit | Attending: Orthopedic Surgery | Admitting: Orthopedic Surgery

## 2019-09-12 DIAGNOSIS — Z20828 Contact with and (suspected) exposure to other viral communicable diseases: Secondary | ICD-10-CM | POA: Diagnosis not present

## 2019-09-12 DIAGNOSIS — M12811 Other specific arthropathies, not elsewhere classified, right shoulder: Secondary | ICD-10-CM | POA: Insufficient documentation

## 2019-09-12 DIAGNOSIS — Z01812 Encounter for preprocedural laboratory examination: Secondary | ICD-10-CM | POA: Diagnosis not present

## 2019-09-12 LAB — CBC
HCT: 43.7 % (ref 39.0–52.0)
Hemoglobin: 14.7 g/dL (ref 13.0–17.0)
MCH: 31.1 pg (ref 26.0–34.0)
MCHC: 33.6 g/dL (ref 30.0–36.0)
MCV: 92.6 fL (ref 80.0–100.0)
Platelets: 265 10*3/uL (ref 150–400)
RBC: 4.72 MIL/uL (ref 4.22–5.81)
RDW: 11.5 % (ref 11.5–15.5)
WBC: 9.8 10*3/uL (ref 4.0–10.5)
nRBC: 0 % (ref 0.0–0.2)

## 2019-09-12 LAB — SURGICAL PCR SCREEN
MRSA, PCR: NEGATIVE
Staphylococcus aureus: NEGATIVE

## 2019-09-12 LAB — BASIC METABOLIC PANEL
Anion gap: 11 (ref 5–15)
BUN: 13 mg/dL (ref 8–23)
CO2: 26 mmol/L (ref 22–32)
Calcium: 9.4 mg/dL (ref 8.9–10.3)
Chloride: 96 mmol/L — ABNORMAL LOW (ref 98–111)
Creatinine, Ser: 0.73 mg/dL (ref 0.61–1.24)
GFR calc Af Amer: >60 mL/min (ref >60)
GFR calc non Af Amer: >60 mL/min (ref >60)
Glucose, Bld: 117 mg/dL — ABNORMAL HIGH (ref 70–99)
Potassium: 5.1 mmol/L (ref 3.5–5.1)
Sodium: 133 mmol/L — ABNORMAL LOW (ref 135–145)

## 2019-09-12 LAB — HEMOGLOBIN A1C
Hgb A1c MFr Bld: 6.3 % — ABNORMAL HIGH (ref 4.8–5.6)
Mean Plasma Glucose: 134.11 mg/dL

## 2019-09-12 NOTE — Progress Notes (Signed)
PCP -  Hulan Fess MD Cardiologist - none  Chest x-ray - none EKG - 05/24/2019 chart Stress Test - none ECHO - none Cardiac Cath - none  Sleep Study - none CPAP - none  Fasting Blood Sugar -  Checks Blood Sugar __0___ times a day  Blood Thinner Instructions: Aspirin Instructions: Last Dose:  Anesthesia review:   Patient denies shortness of breath, fever, cough and chest pain at PAT appointment   Patient verbalized understanding of instructions that were given to them at the PAT appointment. Patient was also instructed that they will need to review over the PAT instructions again at home before surgery.

## 2019-09-13 LAB — NOVEL CORONAVIRUS, NAA (HOSP ORDER, SEND-OUT TO REF LAB; TAT 18-24 HRS): SARS-CoV-2, NAA: NOT DETECTED

## 2019-09-15 ENCOUNTER — Inpatient Hospital Stay (HOSPITAL_COMMUNITY): Payer: BC Managed Care – PPO | Admitting: Certified Registered"

## 2019-09-15 ENCOUNTER — Inpatient Hospital Stay (HOSPITAL_COMMUNITY): Payer: BC Managed Care – PPO

## 2019-09-15 ENCOUNTER — Encounter (HOSPITAL_COMMUNITY): Payer: Self-pay | Admitting: Orthopedic Surgery

## 2019-09-15 ENCOUNTER — Inpatient Hospital Stay (HOSPITAL_COMMUNITY)
Admission: RE | Admit: 2019-09-15 | Discharge: 2019-09-16 | DRG: 483 | Disposition: A | Discharge status: Home / Self Care | Payer: BC Managed Care – PPO | Source: Other Acute Inpatient Hospital | Attending: Orthopedic Surgery | Admitting: Orthopedic Surgery

## 2019-09-15 ENCOUNTER — Inpatient Hospital Stay (HOSPITAL_COMMUNITY): Payer: BC Managed Care – PPO | Admitting: Physician Assistant

## 2019-09-15 ENCOUNTER — Other Ambulatory Visit: Payer: Self-pay

## 2019-09-15 ENCOUNTER — Encounter (HOSPITAL_COMMUNITY)
Admission: RE | Disposition: A | Discharge status: Home / Self Care | Payer: Self-pay | Source: Other Acute Inpatient Hospital | Attending: Orthopedic Surgery

## 2019-09-15 DIAGNOSIS — M1990 Unspecified osteoarthritis, unspecified site: Secondary | ICD-10-CM | POA: Diagnosis not present

## 2019-09-15 DIAGNOSIS — Z96612 Presence of left artificial shoulder joint: Secondary | ICD-10-CM | POA: Diagnosis not present

## 2019-09-15 DIAGNOSIS — Z96611 Presence of right artificial shoulder joint: Secondary | ICD-10-CM

## 2019-09-15 DIAGNOSIS — M75101 Unspecified rotator cuff tear or rupture of right shoulder, not specified as traumatic: Secondary | ICD-10-CM | POA: Diagnosis not present

## 2019-09-15 DIAGNOSIS — I10 Essential (primary) hypertension: Secondary | ICD-10-CM | POA: Diagnosis not present

## 2019-09-15 DIAGNOSIS — K219 Gastro-esophageal reflux disease without esophagitis: Secondary | ICD-10-CM | POA: Diagnosis not present

## 2019-09-15 DIAGNOSIS — Z87891 Personal history of nicotine dependence: Secondary | ICD-10-CM | POA: Diagnosis not present

## 2019-09-15 DIAGNOSIS — R7303 Prediabetes: Secondary | ICD-10-CM | POA: Diagnosis present

## 2019-09-15 DIAGNOSIS — Z79899 Other long term (current) drug therapy: Secondary | ICD-10-CM | POA: Diagnosis not present

## 2019-09-15 DIAGNOSIS — M12811 Other specific arthropathies, not elsewhere classified, right shoulder: Secondary | ICD-10-CM | POA: Diagnosis not present

## 2019-09-15 DIAGNOSIS — G8918 Other acute postprocedural pain: Secondary | ICD-10-CM | POA: Diagnosis not present

## 2019-09-15 DIAGNOSIS — Z471 Aftercare following joint replacement surgery: Secondary | ICD-10-CM | POA: Diagnosis not present

## 2019-09-15 DIAGNOSIS — M19011 Primary osteoarthritis, right shoulder: Secondary | ICD-10-CM | POA: Diagnosis not present

## 2019-09-15 HISTORY — PX: REVERSE SHOULDER ARTHROPLASTY: SHX5054

## 2019-09-15 LAB — GLUCOSE, CAPILLARY: Glucose-Capillary: 107 mg/dL — ABNORMAL HIGH (ref 70–99)

## 2019-09-15 SURGERY — ARTHROPLASTY, SHOULDER, TOTAL, REVERSE
Anesthesia: Regional | Site: Shoulder | Laterality: Right

## 2019-09-15 MED ORDER — BUPIVACAINE-EPINEPHRINE 0.5% -1:200000 IJ SOLN
INTRAMUSCULAR | Status: DC | PRN
  Administered 2019-09-15: 10 mL

## 2019-09-15 MED ORDER — PHENOL 1.4 % MT LIQD: 1.0000 | OROMUCOSAL | Status: DC | PRN

## 2019-09-15 MED ORDER — HYDROMORPHONE HCL 1 MG/ML IJ SOLN: 0.5000 mg | INTRAMUSCULAR | Status: DC | PRN

## 2019-09-15 MED ORDER — SUCCINYLCHOLINE CHLORIDE 200 MG/10ML IV SOSY
PREFILLED_SYRINGE | INTRAVENOUS | Status: DC | PRN
  Administered 2019-09-15: 120 mg via INTRAVENOUS

## 2019-09-15 MED ORDER — FENTANYL CITRATE (PF) 100 MCG/2ML IJ SOLN
INTRAMUSCULAR | Status: AC
  Filled 2019-09-15: qty 2

## 2019-09-15 MED ORDER — ACETAMINOPHEN 500 MG PO TABS: 1000.0000 mg | ORAL_TABLET | Freq: Once | ORAL | Status: DC | PRN

## 2019-09-15 MED ORDER — FLUTICASONE PROPIONATE 50 MCG/ACT NA SUSP
1.0000 | NASAL | Status: DC | PRN
  Filled 2019-09-15: qty 16

## 2019-09-15 MED ORDER — PHENYLEPHRINE 40 MCG/ML (10ML) SYRINGE FOR IV PUSH (FOR BLOOD PRESSURE SUPPORT)
PREFILLED_SYRINGE | INTRAVENOUS | Status: DC | PRN
  Administered 2019-09-15: 120 ug via INTRAVENOUS

## 2019-09-15 MED ORDER — 0.9 % SODIUM CHLORIDE (POUR BTL) OPTIME
TOPICAL | Status: DC | PRN
  Administered 2019-09-15: 08:00:00 1000 mL

## 2019-09-15 MED ORDER — LIDOCAINE 2% (20 MG/ML) 5 ML SYRINGE
INTRAMUSCULAR | Status: AC
  Filled 2019-09-15: qty 5

## 2019-09-15 MED ORDER — SODIUM CHLORIDE 0.9 % IV SOLN: INTRAVENOUS | Status: DC

## 2019-09-15 MED ORDER — ONE-DAILY MULTI VITAMINS PO TABS: 1.0000 | ORAL_TABLET | Freq: Every day | ORAL | Status: DC

## 2019-09-15 MED ORDER — OXYCODONE HCL 5 MG PO TABS: 5.0000 mg | ORAL_TABLET | Freq: Once | ORAL | Status: DC | PRN

## 2019-09-15 MED ORDER — MIDAZOLAM HCL 2 MG/2ML IJ SOLN
INTRAMUSCULAR | Status: DC | PRN
  Administered 2019-09-15: 2 mg via INTRAVENOUS

## 2019-09-15 MED ORDER — BUPIVACAINE HCL (PF) 0.5 % IJ SOLN
INTRAMUSCULAR | Status: DC | PRN
  Administered 2019-09-15: 15 mL via PERINEURAL

## 2019-09-15 MED ORDER — METOCLOPRAMIDE HCL 5 MG/ML IJ SOLN: 5.0000 mg | Freq: Three times a day (TID) | INTRAMUSCULAR | Status: DC | PRN

## 2019-09-15 MED ORDER — PROPOFOL 10 MG/ML IV BOLUS
INTRAVENOUS | Status: DC | PRN
  Administered 2019-09-15: 150 mg via INTRAVENOUS

## 2019-09-15 MED ORDER — PROPOFOL 10 MG/ML IV BOLUS
INTRAVENOUS | Status: AC
  Filled 2019-09-15: qty 20

## 2019-09-15 MED ORDER — DOCUSATE SODIUM 100 MG PO CAPS
100.0000 mg | ORAL_CAPSULE | Freq: Two times a day (BID) | ORAL | Status: DC
  Administered 2019-09-15: 100 mg via ORAL
  Filled 2019-09-15: qty 1

## 2019-09-15 MED ORDER — ONDANSETRON HCL 4 MG/2ML IJ SOLN
INTRAMUSCULAR | Status: AC
  Filled 2019-09-15: qty 2

## 2019-09-15 MED ORDER — EPHEDRINE SULFATE-NACL 50-0.9 MG/10ML-% IV SOSY
PREFILLED_SYRINGE | INTRAVENOUS | Status: DC | PRN
  Administered 2019-09-15: 10 mg via INTRAVENOUS

## 2019-09-15 MED ORDER — STERILE WATER FOR IRRIGATION IR SOLN
Status: DC | PRN
  Administered 2019-09-15: 2000 mL

## 2019-09-15 MED ORDER — METHOCARBAMOL 500 MG PO TABS
500.0000 mg | ORAL_TABLET | Freq: Four times a day (QID) | ORAL | Status: DC | PRN
  Administered 2019-09-15 (×2): 500 mg via ORAL
  Filled 2019-09-15 (×2): qty 1

## 2019-09-15 MED ORDER — DEXAMETHASONE SODIUM PHOSPHATE 10 MG/ML IJ SOLN
INTRAMUSCULAR | Status: AC
  Filled 2019-09-15: qty 1

## 2019-09-15 MED ORDER — FENTANYL CITRATE (PF) 100 MCG/2ML IJ SOLN
INTRAMUSCULAR | Status: DC | PRN
  Administered 2019-09-15 (×2): 50 ug via INTRAVENOUS

## 2019-09-15 MED ORDER — METHOCARBAMOL 500 MG IVPB - SIMPLE MED
500.0000 mg | Freq: Four times a day (QID) | INTRAVENOUS | Status: DC | PRN
  Administered 2019-09-15: 500 mg via INTRAVENOUS
  Filled 2019-09-15: qty 50

## 2019-09-15 MED ORDER — ACETAMINOPHEN 10 MG/ML IV SOLN
1000.0000 mg | Freq: Once | INTRAVENOUS | Status: DC | PRN
  Administered 2019-09-15: 1000 mg via INTRAVENOUS

## 2019-09-15 MED ORDER — POLYETHYLENE GLYCOL 3350 17 G PO PACK: 17.0000 g | PACK | Freq: Every day | ORAL | Status: DC | PRN

## 2019-09-15 MED ORDER — OXYCODONE HCL 5 MG/5ML PO SOLN: 5.0000 mg | Freq: Once | ORAL | Status: DC | PRN

## 2019-09-15 MED ORDER — BUPIVACAINE LIPOSOME 1.3 % IJ SUSP
INTRAMUSCULAR | Status: DC | PRN
  Administered 2019-09-15: 133 mg via PERINEURAL

## 2019-09-15 MED ORDER — OXYCODONE HCL 5 MG PO TABS
5.0000 mg | ORAL_TABLET | ORAL | Status: DC | PRN
  Administered 2019-09-15: 5 mg via ORAL
  Filled 2019-09-15: qty 1

## 2019-09-15 MED ORDER — LACTATED RINGERS IV SOLN: INTRAVENOUS | Status: DC

## 2019-09-15 MED ORDER — ONDANSETRON HCL 4 MG/2ML IJ SOLN: 4.0000 mg | Freq: Four times a day (QID) | INTRAMUSCULAR | Status: DC | PRN

## 2019-09-15 MED ORDER — PANTOPRAZOLE SODIUM 40 MG PO TBEC: 40.0000 mg | DELAYED_RELEASE_TABLET | Freq: Every day | ORAL | Status: DC

## 2019-09-15 MED ORDER — SUCCINYLCHOLINE CHLORIDE 200 MG/10ML IV SOSY
PREFILLED_SYRINGE | INTRAVENOUS | Status: AC
  Filled 2019-09-15: qty 10

## 2019-09-15 MED ORDER — ACETAMINOPHEN 500 MG PO TABS: 1000.0000 mg | ORAL_TABLET | Freq: Four times a day (QID) | ORAL | Status: DC | PRN

## 2019-09-15 MED ORDER — OXYCODONE-ACETAMINOPHEN 5-325 MG PO TABS: 1.0000 | ORAL_TABLET | Freq: Four times a day (QID) | ORAL | 0 refills | Status: AC | PRN

## 2019-09-15 MED ORDER — ONDANSETRON HCL 4 MG PO TABS: 4.0000 mg | ORAL_TABLET | Freq: Four times a day (QID) | ORAL | Status: DC | PRN

## 2019-09-15 MED ORDER — CEFAZOLIN SODIUM-DEXTROSE 2-4 GM/100ML-% IV SOLN
2.0000 g | INTRAVENOUS | Status: AC
  Administered 2019-09-15: 2 g via INTRAVENOUS
  Filled 2019-09-15: qty 100

## 2019-09-15 MED ORDER — BUPIVACAINE HCL 0.25 % IJ SOLN
INTRAMUSCULAR | Status: AC
  Filled 2019-09-15: qty 1

## 2019-09-15 MED ORDER — METHOCARBAMOL 500 MG IVPB - SIMPLE MED
INTRAVENOUS | Status: AC
  Filled 2019-09-15: qty 50

## 2019-09-15 MED ORDER — MENTHOL 3 MG MT LOZG: 1.0000 | LOZENGE | OROMUCOSAL | Status: DC | PRN

## 2019-09-15 MED ORDER — PHENYLEPHRINE HCL-NACL 10-0.9 MG/250ML-% IV SOLN
INTRAVENOUS | Status: DC | PRN
  Administered 2019-09-15: 50 ug/min via INTRAVENOUS

## 2019-09-15 MED ORDER — MIDAZOLAM HCL 2 MG/2ML IJ SOLN
INTRAMUSCULAR | Status: AC
  Filled 2019-09-15: qty 2

## 2019-09-15 MED ORDER — ACETAMINOPHEN 325 MG PO TABS
325.0000 mg | ORAL_TABLET | Freq: Four times a day (QID) | ORAL | Status: DC | PRN
  Administered 2019-09-15 (×2): 650 mg via ORAL
  Filled 2019-09-15 (×2): qty 2

## 2019-09-15 MED ORDER — FENTANYL CITRATE (PF) 100 MCG/2ML IJ SOLN: 25.0000 ug | INTRAMUSCULAR | Status: DC | PRN

## 2019-09-15 MED ORDER — ACETAMINOPHEN 160 MG/5ML PO SOLN: 1000.0000 mg | Freq: Once | ORAL | Status: DC | PRN

## 2019-09-15 MED ORDER — ADULT MULTIVITAMIN W/MINERALS CH
1.0000 | ORAL_TABLET | Freq: Every day | ORAL | Status: DC
  Administered 2019-09-15: 1 via ORAL
  Filled 2019-09-15 (×2): qty 1

## 2019-09-15 MED ORDER — CHLORHEXIDINE GLUCONATE 4 % EX LIQD: 60.0000 mL | Freq: Once | CUTANEOUS | Status: DC

## 2019-09-15 MED ORDER — LIDOCAINE 2% (20 MG/ML) 5 ML SYRINGE
INTRAMUSCULAR | Status: DC | PRN
  Administered 2019-09-15: 50 mg via INTRAVENOUS

## 2019-09-15 MED ORDER — CEFAZOLIN SODIUM-DEXTROSE 2-4 GM/100ML-% IV SOLN
2.0000 g | Freq: Four times a day (QID) | INTRAVENOUS | Status: AC
  Administered 2019-09-15 – 2019-09-16 (×3): 2 g via INTRAVENOUS
  Filled 2019-09-15 (×3): qty 100

## 2019-09-15 MED ORDER — DEXAMETHASONE SODIUM PHOSPHATE 10 MG/ML IJ SOLN
INTRAMUSCULAR | Status: DC | PRN
  Administered 2019-09-15: 8 mg via INTRAVENOUS

## 2019-09-15 MED ORDER — BISACODYL 10 MG RE SUPP: 10.0000 mg | Freq: Every day | RECTAL | Status: DC | PRN

## 2019-09-15 MED ORDER — ONDANSETRON HCL 4 MG/2ML IJ SOLN
INTRAMUSCULAR | Status: DC | PRN
  Administered 2019-09-15: 4 mg via INTRAVENOUS

## 2019-09-15 MED ORDER — METOPROLOL SUCCINATE ER 50 MG PO TB24: 100.0000 mg | ORAL_TABLET | Freq: Every day | ORAL | Status: DC

## 2019-09-15 MED ORDER — HYDROCHLOROTHIAZIDE 12.5 MG PO CAPS
12.5000 mg | ORAL_CAPSULE | Freq: Every day | ORAL | Status: DC
  Administered 2019-09-15: 12.5 mg via ORAL
  Filled 2019-09-15: qty 1

## 2019-09-15 MED ORDER — METOCLOPRAMIDE HCL 5 MG PO TABS: 5.0000 mg | ORAL_TABLET | Freq: Three times a day (TID) | ORAL | Status: DC | PRN

## 2019-09-15 MED ORDER — ACETAMINOPHEN 10 MG/ML IV SOLN
INTRAVENOUS | Status: AC
  Filled 2019-09-15: qty 100

## 2019-09-15 MED ORDER — LISINOPRIL 10 MG PO TABS
10.0000 mg | ORAL_TABLET | Freq: Every day | ORAL | Status: DC
  Administered 2019-09-15: 10 mg via ORAL
  Filled 2019-09-15 (×2): qty 1

## 2019-09-15 SURGICAL SUPPLY — 74 items
AID PSTN UNV HD RSTRNT DISP (MISCELLANEOUS) ×1
BAG SPEC THK2 15X12 ZIP CLS (MISCELLANEOUS)
BAG ZIPLOCK 12X15 (MISCELLANEOUS) IMPLANT
BIT DRILL 1.6MX128 (BIT) IMPLANT
BIT DRILL 170X2.5X (BIT) IMPLANT
BIT DRL 170X2.5X (BIT) ×1
BLADE SAG 18X100X1.27 (BLADE) ×2 IMPLANT
CLSR STERI-STRIP ANTIMIC 1/2X4 (GAUZE/BANDAGES/DRESSINGS) ×1 IMPLANT
COVER BACK TABLE 60X90IN (DRAPES) ×2 IMPLANT
COVER SURGICAL LIGHT HANDLE (MISCELLANEOUS) ×2 IMPLANT
COVER WAND RF STERILE (DRAPES) IMPLANT
CUP HUMERAL 42 PLUS 3 (Orthopedic Implant) ×1 IMPLANT
DECANTER SPIKE VIAL GLASS SM (MISCELLANEOUS) ×2 IMPLANT
DRAPE INCISE IOBAN 66X45 STRL (DRAPES) ×2 IMPLANT
DRAPE ORTHO SPLIT 77X108 STRL (DRAPES) ×4
DRAPE SHEET LG 3/4 BI-LAMINATE (DRAPES) ×2 IMPLANT
DRAPE SURG ORHT 6 SPLT 77X108 (DRAPES) ×2 IMPLANT
DRAPE U-SHAPE 47X51 STRL (DRAPES) ×2 IMPLANT
DRILL 2.5 (BIT) ×2
DRSG ADAPTIC 3X8 NADH LF (GAUZE/BANDAGES/DRESSINGS) ×2 IMPLANT
DRSG PAD ABDOMINAL 8X10 ST (GAUZE/BANDAGES/DRESSINGS) ×2 IMPLANT
DURAPREP 26ML APPLICATOR (WOUND CARE) ×2 IMPLANT
ELECT BLADE TIP CTD 4 INCH (ELECTRODE) ×2 IMPLANT
ELECT NDL TIP 2.8 STRL (NEEDLE) ×1 IMPLANT
ELECT NEEDLE TIP 2.8 STRL (NEEDLE) ×2 IMPLANT
ELECT REM PT RETURN 15FT ADLT (MISCELLANEOUS) ×2 IMPLANT
EPIPHYSI RIGHT SZ 2 (Shoulder) ×2 IMPLANT
EPIPHYSIS RIGHT SZ 2 (Shoulder) IMPLANT
GAUZE SPONGE 4X4 12PLY STRL (GAUZE/BANDAGES/DRESSINGS) ×2 IMPLANT
GLENOSPHERE XTEND RSA 42 SD +4 (Joint) ×1 IMPLANT
GLOVE BIOGEL PI ORTHO PRO 7.5 (GLOVE) ×1
GLOVE BIOGEL PI ORTHO PRO SZ8 (GLOVE) ×1
GLOVE ORTHO TXT STRL SZ7.5 (GLOVE) ×2 IMPLANT
GLOVE PI ORTHO PRO STRL 7.5 (GLOVE) ×1 IMPLANT
GLOVE PI ORTHO PRO STRL SZ8 (GLOVE) ×1 IMPLANT
GLOVE SURG ORTHO 8.5 STRL (GLOVE) ×2 IMPLANT
GOWN STRL REUS W/TWL XL LVL3 (GOWN DISPOSABLE) ×4 IMPLANT
KIT BASIN OR (CUSTOM PROCEDURE TRAY) ×2 IMPLANT
KIT TURNOVER KIT A (KITS) IMPLANT
MANIFOLD NEPTUNE II (INSTRUMENTS) ×2 IMPLANT
METAGLENE DELTA EXTEND (Trauma) IMPLANT
METAGLENE DXTEND (Trauma) ×2 IMPLANT
NDL MAYO CATGUT SZ4 TPR NDL (NEEDLE) IMPLANT
NEEDLE MAYO CATGUT SZ4 (NEEDLE) IMPLANT
NS IRRIG 1000ML POUR BTL (IV SOLUTION) ×2 IMPLANT
PACK SHOULDER (CUSTOM PROCEDURE TRAY) ×2 IMPLANT
PENCIL SMOKE EVACUATOR (MISCELLANEOUS) IMPLANT
PIN GUIDE 1.2 (PIN) ×1 IMPLANT
PIN GUIDE GLENOPHERE 1.5MX300M (PIN) ×1 IMPLANT
PIN METAGLENE 2.5 (PIN) ×1 IMPLANT
PROTECTOR NERVE ULNAR (MISCELLANEOUS) ×2 IMPLANT
RESTRAINT HEAD UNIVERSAL NS (MISCELLANEOUS) ×2 IMPLANT
SCREW 4.5X24MM (Screw) ×2 IMPLANT
SCREW 48L (Screw) ×1 IMPLANT
SCREW BN 24X4.5XLCK STRL (Screw) IMPLANT
SCREW LOCK DELTA XTEND 4.5X30 (Screw) ×1 IMPLANT
SLING ARM FOAM STRAP LRG (SOFTGOODS) ×1 IMPLANT
SMARTMIX MINI TOWER (MISCELLANEOUS)
SPONGE LAP 4X18 RFD (DISPOSABLE) IMPLANT
STEM 12 HA (Stem) ×1 IMPLANT
STRIP CLOSURE SKIN 1/2X4 (GAUZE/BANDAGES/DRESSINGS) ×2 IMPLANT
SUCTION FRAZIER HANDLE 10FR (MISCELLANEOUS) ×1
SUCTION TUBE FRAZIER 10FR DISP (MISCELLANEOUS) ×1 IMPLANT
SUT FIBERWIRE #2 38 T-5 BLUE (SUTURE) ×2
SUT MNCRL AB 4-0 PS2 18 (SUTURE) ×2 IMPLANT
SUT VIC AB 0 CT1 36 (SUTURE) ×4 IMPLANT
SUT VIC AB 0 CT2 27 (SUTURE) ×2 IMPLANT
SUT VIC AB 2-0 CT1 27 (SUTURE) ×2
SUT VIC AB 2-0 CT1 TAPERPNT 27 (SUTURE) ×1 IMPLANT
SUTURE FIBERWR #2 38 T-5 BLUE (SUTURE) ×1 IMPLANT
TAPE CLOTH SURG 6X10 WHT LF (GAUZE/BANDAGES/DRESSINGS) ×1 IMPLANT
TOWEL OR 17X26 10 PK STRL BLUE (TOWEL DISPOSABLE) ×2 IMPLANT
TOWER SMARTMIX MINI (MISCELLANEOUS) IMPLANT
YANKAUER SUCT BULB TIP 10FT TU (MISCELLANEOUS) ×2 IMPLANT

## 2019-09-15 NOTE — Anesthesia Procedure Notes (Signed)
Procedure Name: Intubation Date/Time: 09/15/2019 7:57 AM Performed by: Niel Hummer, CRNA Pre-anesthesia Checklist: Patient identified, Emergency Drugs available, Suction available and Patient being monitored Patient Re-evaluated:Patient Re-evaluated prior to induction Oxygen Delivery Method: Circle system utilized Preoxygenation: Pre-oxygenation with 100% oxygen Induction Type: IV induction and Rapid sequence Laryngoscope Size: Mac and 4 Grade View: Grade I Tube type: Oral Tube size: 7.5 mm Number of attempts: 1 Airway Equipment and Method: Stylet Placement Confirmation: ETT inserted through vocal cords under direct vision,  positive ETCO2 and breath sounds checked- equal and bilateral Secured at: 23 cm Tube secured with: Tape Dental Injury: Teeth and Oropharynx as per pre-operative assessment

## 2019-09-15 NOTE — Plan of Care (Signed)
  Problem: Education: Goal: Knowledge of the prescribed therapeutic regimen will improve Outcome: Progressing Goal: Understanding of activity limitations/precautions following surgery will improve Outcome: Progressing Goal: Individualized Educational Video(s) Outcome: Progressing   Problem: Activity: Goal: Ability to tolerate increased activity will improve Outcome: Progressing   

## 2019-09-15 NOTE — Transfer of Care (Signed)
Immediate Anesthesia Transfer of Care Note  Patient: Ronnie Moody  Procedure(s) Performed: REVERSE SHOULDER ARTHROPLASTY (Right Shoulder)  Patient Location: PACU  Anesthesia Type:General  Level of Consciousness: awake, alert  and oriented  Airway & Oxygen Therapy: Patient Spontanous Breathing and Patient connected to face mask oxygen  Post-op Assessment: Report given to RN and Post -op Vital signs reviewed and stable  Post vital signs: Reviewed and stable  Last Vitals:  Vitals Value Taken Time  BP 142/77 09/15/19 0958  Temp    Pulse 69 09/15/19 0959  Resp 30 09/15/19 0959  SpO2 100 % 09/15/19 0959  Vitals shown include unvalidated device data.  Last Pain:  Vitals:   09/15/19 0609  TempSrc: Oral  PainSc:       Patients Stated Pain Goal: 4 (09/40/76 8088)  Complications: No apparent anesthesia complications

## 2019-09-15 NOTE — Anesthesia Preprocedure Evaluation (Addendum)
Anesthesia Evaluation  Patient identified by MRN, date of birth, ID band Patient awake    Reviewed: Allergy & Precautions, NPO status , Patient's Chart, lab work & pertinent test results, reviewed documented beta blocker date and time   History of Anesthesia Complications Negative for: history of anesthetic complications  Airway Mallampati: II  TM Distance: >3 FB Neck ROM: Full    Dental  (+) Dental Advisory Given, Teeth Intact   Pulmonary neg recent URI, former smoker,    breath sounds clear to auscultation       Cardiovascular hypertension, Pt. on medications and Pt. on home beta blockers  Rhythm:Regular     Neuro/Psych negative neurological ROS  negative psych ROS   GI/Hepatic Neg liver ROS, GERD  Medicated and Controlled,  Endo/Other  negative endocrine ROS  Renal/GU negative Renal ROS     Musculoskeletal  (+) Arthritis ,   Abdominal   Peds  Hematology negative hematology ROS (+)   Anesthesia Other Findings   Reproductive/Obstetrics                            Anesthesia Physical Anesthesia Plan  ASA: II  Anesthesia Plan: General and Regional   Post-op Pain Management:  Regional for Post-op pain   Induction: Intravenous  PONV Risk Score and Plan: 2 and Ondansetron and Dexamethasone  Airway Management Planned: Oral ETT  Additional Equipment: None  Intra-op Plan:   Post-operative Plan: Extubation in OR  Informed Consent: I have reviewed the patients History and Physical, chart, labs and discussed the procedure including the risks, benefits and alternatives for the proposed anesthesia with the patient or authorized representative who has indicated his/her understanding and acceptance.     Dental advisory given  Plan Discussed with: CRNA and Surgeon  Anesthesia Plan Comments:         Anesthesia Quick Evaluation

## 2019-09-15 NOTE — Interval H&P Note (Signed)
History and Physical Interval Note:  09/15/2019 7:34 AM  Ronnie Moody  has presented today for surgery, with the diagnosis of Right shoulder cuff arthropathy.  The various methods of treatment have been discussed with the patient and family. After consideration of risks, benefits and other options for treatment, the patient has consented to  Procedure(s) with comments: REVERSE SHOULDER ARTHROPLASTY (Right) - interscalene block as a surgical intervention.  The patient's history has been reviewed, patient examined, no change in status, stable for surgery.  I have reviewed the patient's chart and labs.  Questions were answered to the patient's satisfaction.     [name]

## 2019-09-15 NOTE — Anesthesia Postprocedure Evaluation (Signed)
Anesthesia Post Note  Patient: Ronnie Moody  Procedure(s) Performed: REVERSE SHOULDER ARTHROPLASTY (Right Shoulder)     Patient location during evaluation: PACU Anesthesia Type: Regional and General Level of consciousness: awake and alert Pain management: pain level controlled Vital Signs Assessment: post-procedure vital signs reviewed and stable Respiratory status: spontaneous breathing, nonlabored ventilation, respiratory function stable and patient connected to nasal cannula oxygen Cardiovascular status: blood pressure returned to baseline and stable Postop Assessment: no apparent nausea or vomiting Anesthetic complications: no    Last Vitals:  Vitals:   09/15/19 1110 09/15/19 1111  BP: (!) 75/53 124/67  Pulse: 65 62  Resp: 16   Temp: 36.6 C   SpO2: 100%     Last Pain:  Vitals:   09/15/19 1030  TempSrc:   PainSc: 5                  Karson Chicas

## 2019-09-15 NOTE — Anesthesia Procedure Notes (Signed)
Anesthesia Regional Block: Interscalene brachial plexus block   Pre-Anesthetic Checklist: ,, timeout performed, Correct Patient, Correct Site, Correct Laterality, Correct Procedure, Correct Position, site marked, Risks and benefits discussed,  Surgical consent,  Pre-op evaluation,  At surgeon's request and post-op pain management  Laterality: Right and Upper  Prep: chloraprep       Needles:  Injection technique: Single-shot     Needle Length: 5cm  Needle Gauge: 22     Additional Needles: Arrow StimuQuik ECHO Echogenic Stimulating PNB Needle  Procedures:,,,, ultrasound used (permanent image in chart),,,,  Narrative:  Start time: 09/15/2019 7:20 AM End time: 09/15/2019 7:26 AM Injection made incrementally with aspirations every 5 mL.  Performed by: Personally  Anesthesiologist: Oleta Mouse, MD

## 2019-09-15 NOTE — Op Note (Signed)
NAME: Ronnie Moody, Ronnie Moody MEDICAL RECORD [id number] ACCOUNT [id number] DATE OF BIRTH:1951-02-27 FACILITY: [facility] LOCATION: [facility] PHYSICIAN:STEVEN Orlena Sheldon, MD  OPERATIVE REPORT  DATE OF PROCEDURE:  09/15/2019  PREOPERATIVE DIAGNOSIS:  Right shoulder rotator cuff tear arthropathy.  POSTOPERATIVE DIAGNOSIS:  Right shoulder rotator cuff tear arthropathy.  PROCEDURE PERFORMED:  Right reverse total shoulder arthroplasty using DePuy Delta Xtend prosthesis, no subscapularis repair was performed.  ATTENDING SURGEON:  Esmond Plants, MD  ASSISTANT:  Norlene Campbell, PA-C.  ANESTHESIA:  General anesthesia was used plus interscalene block.  ESTIMATED BLOOD LOSS:  400 mL.  FLUID REPLACEMENT:  1500 mL crystalloid.  INSTRUMENT COUNTS:  Correct.  COMPLICATIONS:  No complications.  ANTIBIOTICS:  Perioperative antibiotics were given.  INDICATIONS:  The patient is a 68 year old male who presents with a history of worsening right shoulder pain and dysfunction secondary to rotator cuff tear arthropathy.  The patient has had a previous reverse shoulder on the left and done well, presents  now for reverse total shoulder arthroplasty on the right to restore function and eliminate pain.  Informed consent obtained.  DESCRIPTION OF PROCEDURE:  After an adequate level of general anesthesia was achieved, an interscalene block had been placed preoperatively, the patient was positioned in the modified beach chair position.  Right shoulder correctly identified and  sterilely prepped and draped in the usual manner.  Time-out called, verifying correct patient, correct site.  We entered the patient's shoulder using standard deltopectoral approach, starting at the coracoid process, extending down to the anterior  humerus, dissection down through subcutaneous tissues using Bovie electrocautery.  I identified the cephalic vein, took that laterally with the deltoid, pectoralis taken medially.  Conjoined tendon  identified and retracted medially.  We identified the  biceps tendon and tenodesed that in situ with 0 Vicryl figure-of-eight suture x2, incorporating the pectoralis tendon as it inserted on the humerus.  We released the subscapularis remnant off the lesser tuberosity.  This was in poor shape and not  repairable.  We tagged it for protection of the axillary nerve with #2 FiberWire.  We then released the inferior capsule, extending the shoulder and delivering the humeral head out of the wound.  We entered the proximal humerus with a 6 mm reamer,  reaming up to a size 12.  We then used the 12 mm intramedullary resection guide and resected the head at 10 degrees of retroversion.  With the oscillating saw, we removed excess osteophytes with the rongeur.  We then subluxed the humerus posteriorly,  gaining good exposure of the glenoid face.  There was bone-on-bone arthritis noted on both sides.  We did go ahead and remove quite a bit of synovitis in the shoulder.  We did a capsule labral excision.  We noted there to be a little bit of retroversion.   We placed our guide pin.  With that in mind, we were going to ream down the high side and counter for that.  Our guide pin was placed inferiorly and appropriately positioned anterior to posterior.  We then reamed for the metaglene baseplate and were  happy with that.  We drilled our central peg hole and did a peripheral hand reamer.  We then placed our metaglene baseplate and impacted that in position.  We had good bony support.  We then placed a 48 screw locked inferiorly, a 30 locked superiorly and  a 24 locked posteriorly.  The anterior screw hole was too close to the anterior lip of the glenoid.  With the metaglene baseplate  and securely fastened and well supported, we irrigated thoroughly and then placed a 42+4 standard glenosphere onto the  baseplate and secured that with the screw.  Once that was firmly attached, I did a finger sweep to make sure there was no  soft tissue caught in the bearing.  We irrigated again.  We then finished our preparation on the humeral side, selecting a 2 right  metaphyseal reamer and reaming for that.  We then placed a 12 stem, 2 right set on the 0 setting and placed in 10 degrees of retroversion and then used a 42+3 poly trial on the humeral tray.  We reduced the humerus.  We had a nice soft tissue balancing.   It was not too tight, but no gapping with inferior pole or external rotation, no impingement.  We removed the trial components, irrigated thoroughly and then used available bone graft in impaction grafting technique on the humeral side with the HA  coated press-fit stem, 12 with the 2 right metaphysis set on the 0 setting and we placed that in 10 degrees of retroversion. We impacted that in position.  We had good security of that stem.  We then selected the real 42+3 poly, impacted that on the tray  and reduced the shoulder, a nice little pop as it reduced.  Everything nice and tight, nothing gapping at all and the axillary nerve palpated not too tight.  We irrigated thoroughly and then repaired the deltopectoral interval with 0 Vicryl suture,  followed by 2-0 Vicryl for subcutaneous closure and 4-0 Monocryl for skin.  Steri-Strips applied, followed by sterile dressing.  The patient tolerated surgery well.  VN/NUANCE  D:09/15/2019 T:09/15/2019 JOB:[id number]

## 2019-09-15 NOTE — Discharge Instructions (Signed)
Ice to the shoulder constantly.  Keep the incision covered and clean and dry for one week, then ok to get it wet in the shower. ° °Do exercise as instructed several times per day. ° °DO NOT reach behind your back or push up out of a chair with the operative arm. ° °Use a sling while you are up and around for comfort, may remove while seated.  Keep pillow propped behind the operative elbow. ° °Follow up with Dr Kortnie Stovall in two weeks in the office, call [phone] for appt °

## 2019-09-15 NOTE — Brief Op Note (Signed)
09/15/2019  9:33 AM  PATIENT:  Ronnie Moody  68 y.o. male  PRE-OPERATIVE DIAGNOSIS:  Right shoulder cuff arthropathy  POST-OPERATIVE DIAGNOSIS:  Right shoulder cuff arthropathy  PROCEDURE:  Procedure(s) with comments: REVERSE SHOULDER ARTHROPLASTY (Right) - interscalene block DePuy Delta Xtend  SURGEON:  Surgeon(s) and Role:    Netta Cedars, MD - Primary  PHYSICIAN ASSISTANT:   ASSISTANTS: Norlene Campbell, PA-C   ANESTHESIA:   regional and general  EBL:  400 mL   BLOOD ADMINISTERED:none  DRAINS: none   LOCAL MEDICATIONS USED:  MARCAINE     SPECIMEN:  No Specimen  DISPOSITION OF SPECIMEN:  N/A  COUNTS:  YES  TOURNIQUET:  * No tourniquets in log *  DICTATION: .Other Dictation: Dictation Number [id number]  PLAN OF CARE: Admit to inpatient   PATIENT DISPOSITION:  PACU - hemodynamically stable.   Delay start of Pharmacological VTE agent (>24hrs) due to surgical blood loss or risk of bleeding: not applicable

## 2019-09-16 LAB — BASIC METABOLIC PANEL
Anion gap: 9 (ref 5–15)
BUN: 18 mg/dL (ref 8–23)
CO2: 26 mmol/L (ref 22–32)
Calcium: 8.3 mg/dL — ABNORMAL LOW (ref 8.9–10.3)
Chloride: 96 mmol/L — ABNORMAL LOW (ref 98–111)
Creatinine, Ser: 0.94 mg/dL (ref 0.61–1.24)
GFR calc Af Amer: >60 mL/min (ref >60)
GFR calc non Af Amer: >60 mL/min (ref >60)
Glucose, Bld: 175 mg/dL — ABNORMAL HIGH (ref 70–99)
Potassium: 4.1 mmol/L (ref 3.5–5.1)
Sodium: 131 mmol/L — ABNORMAL LOW (ref 135–145)

## 2019-09-16 LAB — HEMOGLOBIN AND HEMATOCRIT, BLOOD
HCT: 32.2 % — ABNORMAL LOW (ref 39.0–52.0)
Hemoglobin: 10.9 g/dL — ABNORMAL LOW (ref 13.0–17.0)

## 2019-09-16 NOTE — Progress Notes (Signed)
Orthopedics Progress Note  Subjective: Patient comfortable this morning and walking the halls.  He is ready to go home.  Objective:  Vitals:   09/16/19 0140 09/16/19 0548  BP: 118/60 131/69  Pulse: 70 78  Resp: 14 16  Temp: 97.7 F (36.5 C) 97.9 F (36.6 C)  SpO2: 96% 100%    General: Awake and alert  Musculoskeletal: dressing changed. Incision looks great, no swelling and no drainage Neurovascularly intact  Lab Results  Component Value Date   WBC 9.8 09/12/2019   HGB 10.9 (L) 09/16/2019   HCT 32.2 (L) 09/16/2019   MCV 92.6 09/12/2019   PLT 265 09/12/2019       Component Value Date/Time   NA 131 (L) 09/16/2019 0204   K 4.1 09/16/2019 0204   CL 96 (L) 09/16/2019 0204   CO2 26 09/16/2019 0204   GLUCOSE 175 (H) 09/16/2019 0204   BUN 18 09/16/2019 0204   CREATININE 0.94 09/16/2019 0204   CALCIUM 8.3 (L) 09/16/2019 0204   GFRNONAA >60 09/16/2019 0204   GFRAA >60 09/16/2019 0204    No results found for: INR, PROTIME  Assessment/Plan: POD #1 s/p Procedure(s): REVERSE SHOULDER ARTHROPLASTY OT then discharge home  [place]. Veverly Fells, MD 09/16/2019 9:04 AM

## 2019-09-16 NOTE — Progress Notes (Signed)
Pt given and explained dc instructions in detail. Patient verbalized understanding; escorted to lobby in wheelchair to be discharged home with family member

## 2019-09-16 NOTE — Discharge Summary (Signed)
Orthopedic Discharge Summary        Physician Discharge Summary  Patient ID: Ronnie Moody MRN: [id number] DOB/AGE: [birth year] 66 y.o.  Admit date: 09/15/2019 Discharge date: 09/16/2019   Procedures:  Procedure(s) (LRB): REVERSE SHOULDER ARTHROPLASTY (Right)  Attending Physician:  Dr. Esmond Plants  Admission Diagnoses:   Right shoulder end stage arthritis  Discharge Diagnoses:  same   Past Medical History:  Diagnosis Date  . Arthritis   . GERD (gastroesophageal reflux disease)   . Hypertension   . Pre-diabetes     PCP: Hulan Fess, MD   Discharged Condition: good  Hospital Course:  Patient underwent the above stated procedure on 09/15/2019. Patient tolerated the procedure well and brought to the recovery room in good condition and subsequently to the floor. Patient had an uncomplicated hospital course and was stable for discharge.   Disposition: Discharge disposition: 01-Home or Self Care      with follow up in 2 weeks   Follow-up Information    Netta Cedars, MD. Call in 2 weeks.   Specialty: Orthopedic Surgery Why: [phone] Contact information: [address] [place] [address] [phone]           Discharge Instructions    Call MD / Call 911   Complete by: As directed    If you experience chest pain or shortness of breath, CALL 911 and be transported to the hospital emergency room.  If you develope a fever above 101 F, pus (white drainage) or increased drainage or redness at the wound, or calf pain, call your surgeon's office.   Constipation Prevention   Complete by: As directed    Drink plenty of fluids.  Prune juice may be helpful.  You may use a stool softener, such as Colace (over the counter) 100 mg twice a day.  Use MiraLax (over the counter) for constipation as needed.   Diet - low sodium heart healthy   Complete by: As directed    Increase activity slowly as tolerated   Complete by: As directed        Allergies as of 09/16/2019   No Known Allergies     Medication List    TAKE these medications   acetaminophen 500 MG tablet Commonly known as: TYLENOL Take 1,000 mg by mouth every 6 (six) hours as needed for moderate pain.   Fish Oil 1200 MG Caps Take 1,200 mg by mouth daily.   fluticasone 50 MCG/ACT nasal spray Commonly known as: FLONASE Place 1 spray into both nostrils as needed for allergies or rhinitis.   hydrochlorothiazide 12.5 MG capsule Commonly known as: MICROZIDE Take 12.5 mg by mouth daily.   lisinopril 10 MG tablet Commonly known as: ZESTRIL Take 10 mg by mouth daily.   methocarbamol 500 MG tablet Commonly known as: Robaxin Take 1 tablet (500 mg total) by mouth 3 (three) times daily as needed.   metoprolol succinate 100 MG 24 hr tablet Commonly known as: TOPROL-XL Take 100 mg by mouth daily.   multivitamin tablet Take 1 tablet by mouth daily.   omeprazole 20 MG capsule Commonly known as: PRILOSEC Take 20 mg by mouth daily.   oxyCODONE-acetaminophen 5-325 MG tablet Commonly known as: Percocet Take 1 tablet by mouth every 6 (six) hours as needed for severe pain.         Signed: [name] 09/16/2019, 9:22 AM  [facility] is now [employer] [address], [place], [place], [place] [address] Phone: [phone] Facebook  Instagram  [employer]

## 2019-09-16 NOTE — Evaluation (Signed)
Occupational Therapy Evaluation Patient Details Name: Ronnie Moody MRN: [id number] DOB: December 13, 1950 Today's Date: 09/16/2019    History of Present Illness REVERSE SHOULDER ARTHROPLASTY (Right)   Clinical Impression   Pt requires min A with UB and LB ADLs,independent with mobility with no AD. Pt had  previous L shoulder surgery and is competent with ADL complensatory techniques. Pt instructed on shoulder protocol, ADLs and  MD/surgeon prescribed A/ROM exercises of R UE (FF, ER and ABD) with handouts provided. Pt live sat home with his daughter and was independent with ADLs/selfcare and mobility PTA; pt also driving and works. Pt will have assist as needed from family at home. All education completed and no further acute OT is indicated at this time    Follow Up Recommendations  Follow surgeon's recommendation for DC plan and follow-up therapies    Equipment Recommendations  None recommended by OT    Recommendations for Other Services       Precautions / Restrictions Precautions Precautions: Shoulder Shoulder Interventions: For comfort;Shoulder sling/immobilizer(for sleeping) Precaution Booklet Issued: Yes (comment) Precaution Comments: pt edcuated on shoulder protocol and ROM excercises as allowed per MD, handouts provided Restrictions Weight Bearing Restrictions: Yes RUE Weight Bearing: Non weight bearing      Mobility Bed Mobility Overal bed mobility: Independent                Transfers Overall transfer level: Independent Equipment used: None                  Balance Overall balance assessment: No apparent balance deficits (not formally assessed)                                         ADL either performed or assessed with clinical judgement   ADL Overall ADL's : Needs assistance/impaired Eating/Feeding: Independent;Sitting   Grooming: Wash/dry hands;Wash/dry face;Oral care;Standing   Upper Body Bathing: Minimal assistance    Lower Body Bathing: Minimal assistance   Upper Body Dressing : Minimal assistance   Lower Body Dressing: Minimal assistance       Toileting- Clothing Manipulation and Hygiene: Independent   Tub/ Shower Transfer: Independent   Functional mobility during ADLs: Independent General ADL Comments: Pt had  previous L shoulder surgery and is competent with ADL complensatory techniques     Vision Baseline Vision/History: Wears glasses Patient Visual Report: No change from baseline       Perception     Praxis      Pertinent Vitals/Pain Pain Assessment: 0-10 Pain Score: 1  Pain Descriptors / Indicators: Sore Pain Intervention(s): Monitored during session;Repositioned     Hand Dominance Right   Extremity/Trunk Assessment Upper Extremity Assessment Upper Extremity Assessment: RUE deficits/detail RUE Deficits / Details: sling RUE: Unable to fully assess due to immobilization       Cervical / Trunk Assessment Cervical / Trunk Assessment: Normal   Communication Communication Communication: No difficulties   Cognition Arousal/Alertness: Awake/alert Behavior During Therapy: WFL for tasks assessed/performed Overall Cognitive Status: Within Functional Limits for tasks assessed                                     General Comments       Exercises Shoulder Exercises Shoulder Flexion: PROM;AROM;Right;5 reps Shoulder ABduction: PROM;AROM;Right;5 reps Shoulder External Rotation: PROM;AROM;Right;5 reps Elbow Flexion: AROM;Right;5 reps  Elbow Extension: AROM;Right;5 reps Wrist Flexion: AROM;Right;5 reps Wrist Extension: AROM;Right;5 reps Digit Composite Flexion: AROM;Right;5 reps   Shoulder Instructions Shoulder Instructions Donning/doffing shirt without moving shoulder: Minimal assistance Method for sponge bathing under operated UE: Independent Donning/doffing sling/immobilizer: Supervision/safety Correct positioning of sling/immobilizer: Independent ROM  for elbow, wrist and digits of operated UE: Independent Sling wearing schedule (on at all times/off for ADL's): Independent Proper positioning of operated UE when showering: Independent Positioning of UE while sleeping: Independent    Home Living Family/patient expects to be discharged to:: Private residence Living Arrangements: Children;Spouse/significant other Available Help at Discharge: Family;Available PRN/intermittently Type of Home: House Home Access: Level entry     Home Layout: One level     Bathroom Shower/Tub: Producer, television/film/video: Handicapped height     Home Equipment: None          Prior Functioning/Environment Level of Independence: Independent                 OT Problem List: Impaired UE functional use;Pain;Decreased range of motion      OT Treatment/Interventions:      OT Goals(Current goals can be found in the care plan section) Acute Rehab OT Goals Patient Stated Goal: go home OT Goal Formulation: With patient  OT Frequency:     Barriers to D/C:            Co-evaluation              AM-PAC OT "6 Clicks" Daily Activity     Outcome Measure Help from another person eating meals?: None Help from another person taking care of personal grooming?: None Help from another person toileting, which includes using toliet, bedpan, or urinal?: None Help from another person bathing (including washing, rinsing, drying)?: A Little Help from another person to put on and taking off regular upper body clothing?: A Little Help from another person to put on and taking off regular lower body clothing?: A Little 6 Click Score: 21   End of Session Equipment Utilized During Treatment: Other (comment)(R UE sling)  Activity Tolerance:   Patient left: in chair  OT Visit Diagnosis: Pain Pain - Right/Left: Right Pain - part of body: Shoulder                Time: 0017-4944 OT Time Calculation (min): 38 min Charges:  OT General Charges $OT  Visit: 1 Visit OT Evaluation $OT Eval Low Complexity: 1 Low OT Treatments $Therapeutic Activity: 8-22 mins $Therapeutic Exercise: 8-22 mins    Galen Manila 09/16/2019, 10:29 AM

## 2019-09-18 ENCOUNTER — Encounter: Payer: Self-pay | Admitting: *Deleted

## 2019-10-03 DIAGNOSIS — Z4789 Encounter for other orthopedic aftercare: Secondary | ICD-10-CM | POA: Diagnosis not present

## 2019-10-20 DIAGNOSIS — N400 Enlarged prostate without lower urinary tract symptoms: Secondary | ICD-10-CM | POA: Diagnosis not present

## 2019-10-20 DIAGNOSIS — D0472 Carcinoma in situ of skin of left lower limb, including hip: Secondary | ICD-10-CM | POA: Diagnosis not present

## 2019-10-20 DIAGNOSIS — D485 Neoplasm of uncertain behavior of skin: Secondary | ICD-10-CM | POA: Diagnosis not present

## 2019-10-20 DIAGNOSIS — L578 Other skin changes due to chronic exposure to nonionizing radiation: Secondary | ICD-10-CM | POA: Diagnosis not present

## 2019-10-20 DIAGNOSIS — L821 Other seborrheic keratosis: Secondary | ICD-10-CM | POA: Diagnosis not present

## 2019-10-20 DIAGNOSIS — D2271 Melanocytic nevi of right lower limb, including hip: Secondary | ICD-10-CM | POA: Diagnosis not present

## 2019-10-20 DIAGNOSIS — L570 Actinic keratosis: Secondary | ICD-10-CM | POA: Diagnosis not present

## 2019-10-20 DIAGNOSIS — R7301 Impaired fasting glucose: Secondary | ICD-10-CM | POA: Diagnosis not present

## 2019-10-20 DIAGNOSIS — E7800 Pure hypercholesterolemia, unspecified: Secondary | ICD-10-CM | POA: Diagnosis not present

## 2019-10-20 DIAGNOSIS — I10 Essential (primary) hypertension: Secondary | ICD-10-CM | POA: Diagnosis not present

## 2019-10-31 DIAGNOSIS — Z4789 Encounter for other orthopedic aftercare: Secondary | ICD-10-CM | POA: Diagnosis not present

## 2019-11-23 DIAGNOSIS — L570 Actinic keratosis: Secondary | ICD-10-CM | POA: Diagnosis not present

## 2019-11-23 DIAGNOSIS — D0472 Carcinoma in situ of skin of left lower limb, including hip: Secondary | ICD-10-CM | POA: Diagnosis not present

## 2019-12-12 DIAGNOSIS — Z4789 Encounter for other orthopedic aftercare: Secondary | ICD-10-CM | POA: Diagnosis not present

## 2020-01-05 DIAGNOSIS — Z1159 Encounter for screening for other viral diseases: Secondary | ICD-10-CM | POA: Diagnosis not present

## 2020-01-05 DIAGNOSIS — D649 Anemia, unspecified: Secondary | ICD-10-CM | POA: Diagnosis not present

## 2020-01-05 DIAGNOSIS — Z23 Encounter for immunization: Secondary | ICD-10-CM | POA: Diagnosis not present

## 2020-01-05 DIAGNOSIS — Z125 Encounter for screening for malignant neoplasm of prostate: Secondary | ICD-10-CM | POA: Diagnosis not present

## 2020-01-05 DIAGNOSIS — R7301 Impaired fasting glucose: Secondary | ICD-10-CM | POA: Diagnosis not present

## 2020-01-05 DIAGNOSIS — I10 Essential (primary) hypertension: Secondary | ICD-10-CM | POA: Diagnosis not present

## 2020-03-13 DIAGNOSIS — Z96611 Presence of right artificial shoulder joint: Secondary | ICD-10-CM | POA: Diagnosis not present

## 2020-03-13 DIAGNOSIS — Z471 Aftercare following joint replacement surgery: Secondary | ICD-10-CM | POA: Diagnosis not present

## 2020-04-12 DIAGNOSIS — E871 Hypo-osmolality and hyponatremia: Secondary | ICD-10-CM | POA: Diagnosis not present

## 2020-04-12 DIAGNOSIS — D509 Iron deficiency anemia, unspecified: Secondary | ICD-10-CM | POA: Diagnosis not present

## 2020-04-12 DIAGNOSIS — R7303 Prediabetes: Secondary | ICD-10-CM | POA: Diagnosis not present

## 2020-04-24 DIAGNOSIS — M9902 Segmental and somatic dysfunction of thoracic region: Secondary | ICD-10-CM | POA: Diagnosis not present

## 2020-04-24 DIAGNOSIS — M545 Low back pain: Secondary | ICD-10-CM | POA: Diagnosis not present

## 2020-04-24 DIAGNOSIS — M9903 Segmental and somatic dysfunction of lumbar region: Secondary | ICD-10-CM | POA: Diagnosis not present

## 2020-04-24 DIAGNOSIS — M62830 Muscle spasm of back: Secondary | ICD-10-CM | POA: Diagnosis not present

## 2020-04-25 DIAGNOSIS — M9903 Segmental and somatic dysfunction of lumbar region: Secondary | ICD-10-CM | POA: Diagnosis not present

## 2020-04-25 DIAGNOSIS — M9902 Segmental and somatic dysfunction of thoracic region: Secondary | ICD-10-CM | POA: Diagnosis not present

## 2020-04-25 DIAGNOSIS — M545 Low back pain: Secondary | ICD-10-CM | POA: Diagnosis not present

## 2020-04-25 DIAGNOSIS — M62830 Muscle spasm of back: Secondary | ICD-10-CM | POA: Diagnosis not present

## 2020-04-29 DIAGNOSIS — M9903 Segmental and somatic dysfunction of lumbar region: Secondary | ICD-10-CM | POA: Diagnosis not present

## 2020-04-29 DIAGNOSIS — M9902 Segmental and somatic dysfunction of thoracic region: Secondary | ICD-10-CM | POA: Diagnosis not present

## 2020-04-29 DIAGNOSIS — M62830 Muscle spasm of back: Secondary | ICD-10-CM | POA: Diagnosis not present

## 2020-04-29 DIAGNOSIS — M545 Low back pain: Secondary | ICD-10-CM | POA: Diagnosis not present

## 2020-04-30 DIAGNOSIS — M9902 Segmental and somatic dysfunction of thoracic region: Secondary | ICD-10-CM | POA: Diagnosis not present

## 2020-04-30 DIAGNOSIS — M545 Low back pain: Secondary | ICD-10-CM | POA: Diagnosis not present

## 2020-04-30 DIAGNOSIS — M62830 Muscle spasm of back: Secondary | ICD-10-CM | POA: Diagnosis not present

## 2020-04-30 DIAGNOSIS — M9903 Segmental and somatic dysfunction of lumbar region: Secondary | ICD-10-CM | POA: Diagnosis not present

## 2020-05-01 DIAGNOSIS — L821 Other seborrheic keratosis: Secondary | ICD-10-CM | POA: Diagnosis not present

## 2020-05-01 DIAGNOSIS — L578 Other skin changes due to chronic exposure to nonionizing radiation: Secondary | ICD-10-CM | POA: Diagnosis not present

## 2020-05-01 DIAGNOSIS — D2272 Melanocytic nevi of left lower limb, including hip: Secondary | ICD-10-CM | POA: Diagnosis not present

## 2020-05-01 DIAGNOSIS — L570 Actinic keratosis: Secondary | ICD-10-CM | POA: Diagnosis not present

## 2020-05-02 DIAGNOSIS — R7301 Impaired fasting glucose: Secondary | ICD-10-CM | POA: Diagnosis not present

## 2020-05-02 DIAGNOSIS — D509 Iron deficiency anemia, unspecified: Secondary | ICD-10-CM | POA: Diagnosis not present

## 2020-05-02 DIAGNOSIS — M62830 Muscle spasm of back: Secondary | ICD-10-CM | POA: Diagnosis not present

## 2020-05-02 DIAGNOSIS — M9902 Segmental and somatic dysfunction of thoracic region: Secondary | ICD-10-CM | POA: Diagnosis not present

## 2020-05-02 DIAGNOSIS — Z23 Encounter for immunization: Secondary | ICD-10-CM | POA: Diagnosis not present

## 2020-05-02 DIAGNOSIS — M545 Low back pain: Secondary | ICD-10-CM | POA: Diagnosis not present

## 2020-05-02 DIAGNOSIS — M9903 Segmental and somatic dysfunction of lumbar region: Secondary | ICD-10-CM | POA: Diagnosis not present

## 2020-05-02 DIAGNOSIS — E871 Hypo-osmolality and hyponatremia: Secondary | ICD-10-CM | POA: Diagnosis not present

## 2020-05-06 DIAGNOSIS — M9903 Segmental and somatic dysfunction of lumbar region: Secondary | ICD-10-CM | POA: Diagnosis not present

## 2020-05-06 DIAGNOSIS — M545 Low back pain: Secondary | ICD-10-CM | POA: Diagnosis not present

## 2020-05-06 DIAGNOSIS — M62830 Muscle spasm of back: Secondary | ICD-10-CM | POA: Diagnosis not present

## 2020-05-06 DIAGNOSIS — M9902 Segmental and somatic dysfunction of thoracic region: Secondary | ICD-10-CM | POA: Diagnosis not present

## 2020-05-08 DIAGNOSIS — M62830 Muscle spasm of back: Secondary | ICD-10-CM | POA: Diagnosis not present

## 2020-05-08 DIAGNOSIS — M545 Low back pain: Secondary | ICD-10-CM | POA: Diagnosis not present

## 2020-05-08 DIAGNOSIS — M9903 Segmental and somatic dysfunction of lumbar region: Secondary | ICD-10-CM | POA: Diagnosis not present

## 2020-05-08 DIAGNOSIS — M9902 Segmental and somatic dysfunction of thoracic region: Secondary | ICD-10-CM | POA: Diagnosis not present

## 2020-05-13 DIAGNOSIS — M62830 Muscle spasm of back: Secondary | ICD-10-CM | POA: Diagnosis not present

## 2020-05-13 DIAGNOSIS — M545 Low back pain: Secondary | ICD-10-CM | POA: Diagnosis not present

## 2020-05-13 DIAGNOSIS — M9903 Segmental and somatic dysfunction of lumbar region: Secondary | ICD-10-CM | POA: Diagnosis not present

## 2020-05-13 DIAGNOSIS — M9902 Segmental and somatic dysfunction of thoracic region: Secondary | ICD-10-CM | POA: Diagnosis not present

## 2020-05-15 IMAGING — DX DG SHOULDER 2+V PORT*R*
1 series · 1 of 1 positions shown · non-contrast
Comparison: None.

CLINICAL DATA: Status post right shoulder replacement.

EXAM:
PORTABLE RIGHT SHOULDER

[shoulder ap]
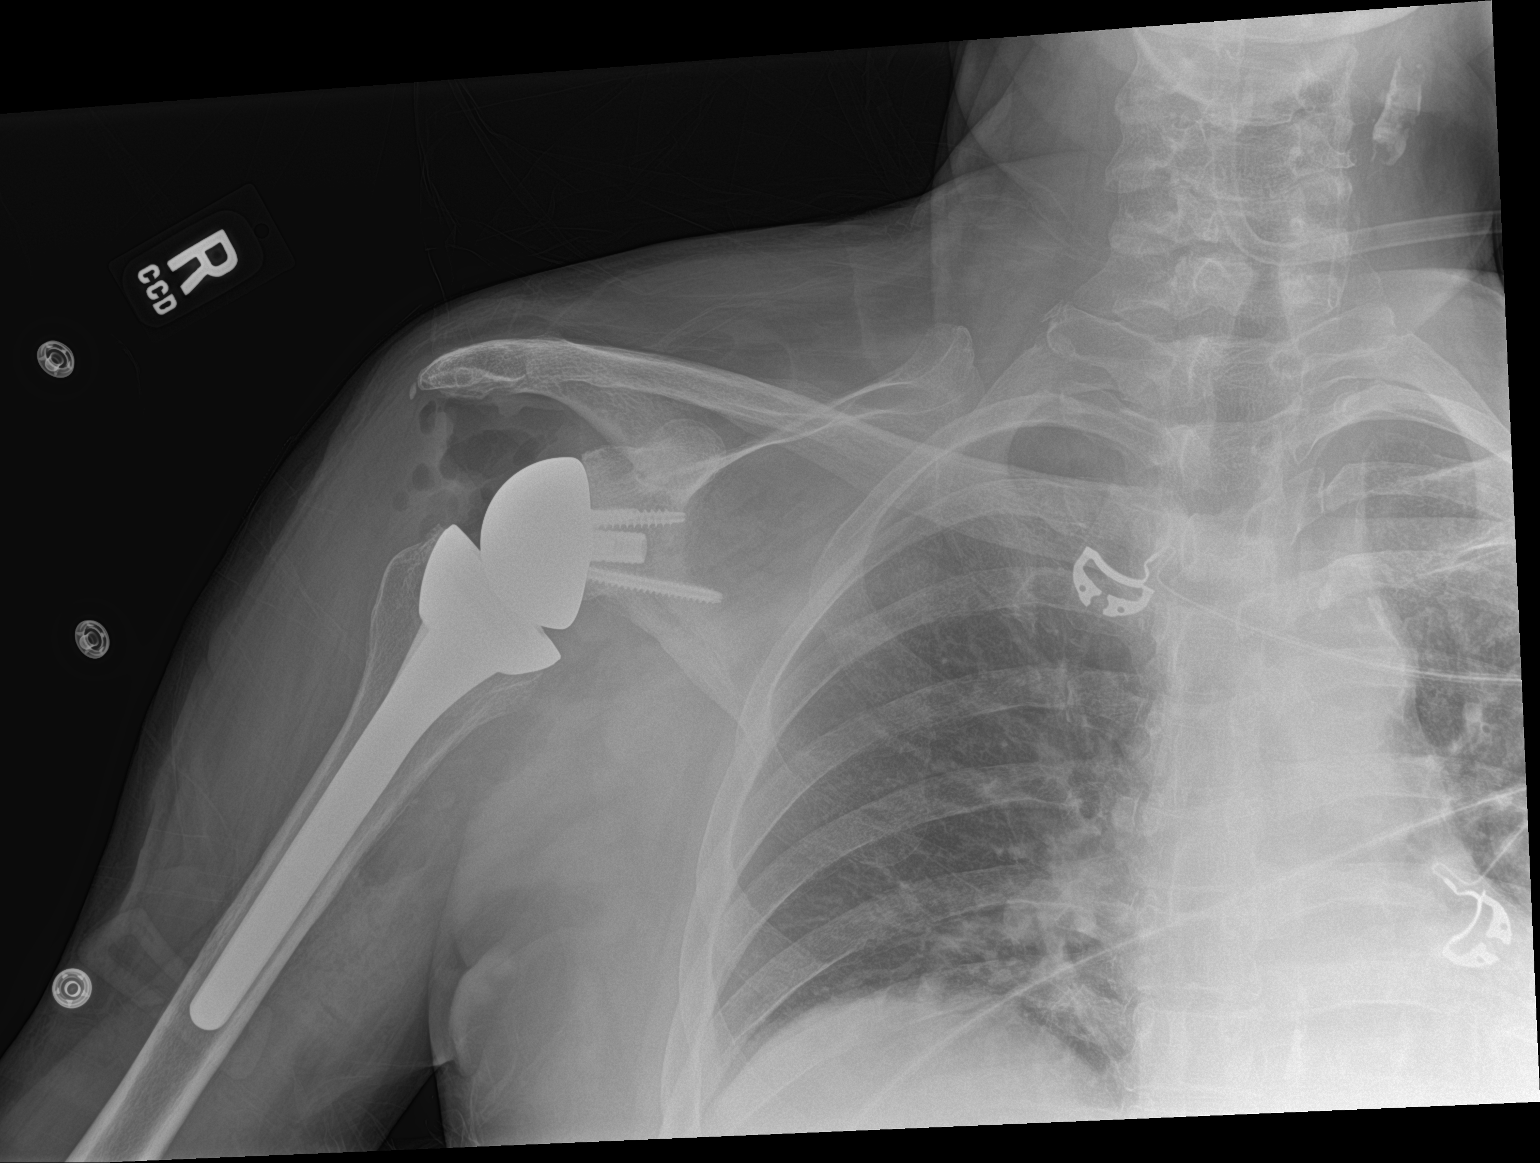

[1 of 1 positions shown; findings below may reference images not displayed]

FINDINGS: The right glenoid and humeral components appear to be well situated.
No fracture or dislocation is noted. Expected postoperative changes
are seen in the surrounding soft tissues.
IMPRESSION: Status post right total shoulder arthroplasty.

## 2020-06-06 DIAGNOSIS — Z7901 Long term (current) use of anticoagulants: Secondary | ICD-10-CM | POA: Diagnosis not present

## 2020-10-03 DIAGNOSIS — U071 COVID-19: Secondary | ICD-10-CM | POA: Diagnosis not present

## 2020-11-14 DIAGNOSIS — D485 Neoplasm of uncertain behavior of skin: Secondary | ICD-10-CM | POA: Diagnosis not present

## 2020-11-14 DIAGNOSIS — L570 Actinic keratosis: Secondary | ICD-10-CM | POA: Diagnosis not present

## 2020-11-14 DIAGNOSIS — D044 Carcinoma in situ of skin of scalp and neck: Secondary | ICD-10-CM | POA: Diagnosis not present

## 2020-11-14 DIAGNOSIS — D0439 Carcinoma in situ of skin of other parts of face: Secondary | ICD-10-CM | POA: Diagnosis not present

## 2020-11-14 DIAGNOSIS — L578 Other skin changes due to chronic exposure to nonionizing radiation: Secondary | ICD-10-CM | POA: Diagnosis not present

## 2020-11-14 DIAGNOSIS — D225 Melanocytic nevi of trunk: Secondary | ICD-10-CM | POA: Diagnosis not present

## 2020-11-14 DIAGNOSIS — L821 Other seborrheic keratosis: Secondary | ICD-10-CM | POA: Diagnosis not present

## 2021-01-22 DIAGNOSIS — I10 Essential (primary) hypertension: Secondary | ICD-10-CM | POA: Diagnosis not present

## 2021-01-22 DIAGNOSIS — E871 Hypo-osmolality and hyponatremia: Secondary | ICD-10-CM | POA: Diagnosis not present

## 2021-01-22 DIAGNOSIS — Z131 Encounter for screening for diabetes mellitus: Secondary | ICD-10-CM | POA: Diagnosis not present

## 2021-01-22 DIAGNOSIS — Z0000 Encounter for general adult medical examination without abnormal findings: Secondary | ICD-10-CM | POA: Diagnosis not present

## 2021-01-22 DIAGNOSIS — F1010 Alcohol abuse, uncomplicated: Secondary | ICD-10-CM | POA: Diagnosis not present

## 2021-01-22 DIAGNOSIS — Z13220 Encounter for screening for lipoid disorders: Secondary | ICD-10-CM | POA: Diagnosis not present

## 2021-02-05 DIAGNOSIS — J019 Acute sinusitis, unspecified: Secondary | ICD-10-CM | POA: Diagnosis not present

## 2021-03-07 DIAGNOSIS — L570 Actinic keratosis: Secondary | ICD-10-CM | POA: Diagnosis not present

## 2021-03-07 DIAGNOSIS — Z85828 Personal history of other malignant neoplasm of skin: Secondary | ICD-10-CM | POA: Diagnosis not present

## 2021-03-07 DIAGNOSIS — L820 Inflamed seborrheic keratosis: Secondary | ICD-10-CM | POA: Diagnosis not present

## 2021-05-15 DIAGNOSIS — L814 Other melanin hyperpigmentation: Secondary | ICD-10-CM | POA: Diagnosis not present

## 2021-05-15 DIAGNOSIS — L821 Other seborrheic keratosis: Secondary | ICD-10-CM | POA: Diagnosis not present

## 2021-05-15 DIAGNOSIS — L578 Other skin changes due to chronic exposure to nonionizing radiation: Secondary | ICD-10-CM | POA: Diagnosis not present

## 2021-05-15 DIAGNOSIS — D225 Melanocytic nevi of trunk: Secondary | ICD-10-CM | POA: Diagnosis not present

## 2021-05-15 DIAGNOSIS — L570 Actinic keratosis: Secondary | ICD-10-CM | POA: Diagnosis not present

## 2021-06-10 ENCOUNTER — Ambulatory Visit: Payer: Self-pay | Admitting: Surgery

## 2021-06-10 DIAGNOSIS — K4020 Bilateral inguinal hernia, without obstruction or gangrene, not specified as recurrent: Secondary | ICD-10-CM | POA: Diagnosis not present

## 2021-07-02 NOTE — Progress Notes (Signed)
Surgical Instructions    Your procedure is scheduled on Thursday, October 13th, 2022.   Report to [facility] Main Entrance "A" at 07:30 A.M., then check in with the Admitting office.  Call this number if you have problems the morning of surgery:  [phone]   If you have any questions prior to your surgery date call [phone]: Open Monday-Friday 8am-4pm    Remember:  Do not eat after midnight the night before your surgery  You may drink clear liquids until 06:30 the morning of your surgery.   Clear liquids allowed are: Water, Non-Citrus Juices (without pulp), Carbonated Beverages, Clear Tea, Black Coffee ONLY (NO MILK, CREAM OR POWDERED CREAMER of any kind), and Gatorade    Take these medicines the morning of surgery with A SIP OF WATER:  cetirizine (ZYRTEC)  metoprolol succinate (TOPROL-XL)  omeprazole (PRILOSEC)    As of today, STOP taking any Aspirin (unless otherwise instructed by your surgeon) Aleve, Naproxen, Ibuprofen, Motrin, Advil, Goody's, BC's, all herbal medications, fish oil, and all vitamins.   After your COVID test   You are not required to quarantine however you are required to wear a well-fitting mask when you are out and around people not in your household.  If your mask becomes wet or soiled, replace with a new one.  Wash your hands often with soap and water for 20 seconds or clean your hands with an alcohol-based hand sanitizer that contains at least 60% alcohol.  Do not share personal items.  Notify your provider: if you are in close contact with someone who has COVID  or if you develop a fever of 100.4 or greater, sneezing, cough, sore throat, shortness of breath or body aches.    The day of surgery:           Do not wear jewelry  Do not wear lotions, powders, colognes, or deodorant. Men may shave face and neck. Do not bring valuables to the hospital.              [facility] is not responsible for any belongings or valuables.  Do NOT  Smoke (Tobacco/Vaping)  24 hours prior to your procedure If you use a CPAP at night, you may bring your mask for your overnight stay.   Contacts, glasses, dentures or bridgework may not be worn into surgery, please bring cases for these belongings   For patients admitted to the hospital, discharge time will be determined by your treatment team.   Patients discharged the day of surgery will not be allowed to drive home, and someone needs to stay with them for 24 hours.  NO VISITORS WILL BE ALLOWED IN PRE-OP WHERE PATIENTS ARE PREPPED FOR SURGERY.  ONLY 1 SUPPORT PERSON MAY BE PRESENT IN THE WAITING ROOM WHILE YOU ARE IN SURGERY.  IF YOU ARE TO BE ADMITTED, ONCE YOU ARE IN YOUR ROOM YOU WILL BE ALLOWED TWO (2) VISITORS. 1 (ONE) VISITOR MAY STAY OVERNIGHT BUT MUST ARRIVE TO THE ROOM BY 8pm.  Minor children may have two parents present. Special consideration for safety and communication needs will be reviewed on a case by case basis.  Special instructions:    Oral Hygiene is also important to reduce your risk of infection.  Remember - BRUSH YOUR TEETH THE MORNING OF SURGERY WITH YOUR REGULAR TOOTHPASTE   [place]- Preparing For Surgery  Before surgery, you can play an important role. Because skin is not sterile, your skin needs to be as free of germs as  possible. You can reduce the number of germs on your skin by washing with CHG (chlorahexidine gluconate) Soap before surgery.  CHG is an antiseptic cleaner which kills germs and bonds with the skin to continue killing germs even after washing.     Please do not use if you have an allergy to CHG or antibacterial soaps. If your skin becomes reddened/irritated stop using the CHG.  Do not shave (including legs and underarms) for at least 48 hours prior to first CHG shower. It is OK to shave your face.  Please follow these instructions carefully.     Shower the NIGHT BEFORE SURGERY and the MORNING OF SURGERY with CHG Soap.   If you chose to wash  your hair, wash your hair first as usual with your normal shampoo. After you shampoo, rinse your hair and body thoroughly to remove the shampoo.  Then [employer] and genitals (private parts) with your normal soap and rinse thoroughly to remove soap.  After that Use CHG Soap as you would any other liquid soap. You can apply CHG directly to the skin and wash gently with a scrungie or a clean washcloth.   Apply the CHG Soap to your body ONLY FROM THE NECK DOWN.  Do not use on open wounds or open sores. Avoid contact with your eyes, ears, mouth and genitals (private parts). Wash Face and genitals (private parts)  with your normal soap.   Wash thoroughly, paying special attention to the area where your surgery will be performed.  Thoroughly rinse your body with warm water from the neck down.  DO NOT shower/wash with your normal soap after using and rinsing off the CHG Soap.  Pat yourself dry with a CLEAN TOWEL.  Wear CLEAN PAJAMAS to bed the night before surgery  Place CLEAN SHEETS on your bed the night before your surgery  DO NOT SLEEP WITH PETS.   Day of Surgery:  Take a shower with CHG soap. Wear Clean/Comfortable clothing the morning of surgery Do not apply any deodorants/lotions.   Remember to brush your teeth WITH YOUR REGULAR TOOTHPASTE.   Please read over the following fact sheets that you were given.

## 2021-07-03 ENCOUNTER — Encounter (HOSPITAL_COMMUNITY)
Admission: RE | Admit: 2021-07-03 | Discharge: 2021-07-03 | Disposition: A | Discharge status: Home / Self Care | Payer: BC Managed Care – PPO | Source: Ambulatory Visit | Attending: Surgery | Admitting: Surgery

## 2021-07-03 ENCOUNTER — Other Ambulatory Visit: Payer: Self-pay

## 2021-07-03 ENCOUNTER — Encounter (HOSPITAL_COMMUNITY): Payer: Self-pay

## 2021-07-03 DIAGNOSIS — Z79899 Other long term (current) drug therapy: Secondary | ICD-10-CM | POA: Insufficient documentation

## 2021-07-03 DIAGNOSIS — R7303 Prediabetes: Secondary | ICD-10-CM | POA: Insufficient documentation

## 2021-07-03 DIAGNOSIS — K4020 Bilateral inguinal hernia, without obstruction or gangrene, not specified as recurrent: Secondary | ICD-10-CM | POA: Diagnosis not present

## 2021-07-03 DIAGNOSIS — Z01818 Encounter for other preprocedural examination: Secondary | ICD-10-CM | POA: Diagnosis not present

## 2021-07-03 DIAGNOSIS — I10 Essential (primary) hypertension: Secondary | ICD-10-CM | POA: Insufficient documentation

## 2021-07-03 LAB — COMPREHENSIVE METABOLIC PANEL
ALT: 15 U/L (ref 0–44)
AST: 25 U/L (ref 15–41)
Albumin: 4 g/dL (ref 3.5–5.0)
Alkaline Phosphatase: 53 U/L (ref 38–126)
Anion gap: 11 (ref 5–15)
BUN: 15 mg/dL (ref 8–23)
CO2: 26 mmol/L (ref 22–32)
Calcium: 9.5 mg/dL (ref 8.9–10.3)
Chloride: 90 mmol/L — ABNORMAL LOW (ref 98–111)
Creatinine, Ser: 0.86 mg/dL (ref 0.61–1.24)
GFR, Estimated: >60 mL/min (ref >60)
Glucose, Bld: 103 mg/dL — ABNORMAL HIGH (ref 70–99)
Potassium: 4.3 mmol/L (ref 3.5–5.1)
Sodium: 127 mmol/L — ABNORMAL LOW (ref 135–145)
Total Bilirubin: 1.2 mg/dL (ref 0.3–1.2)
Total Protein: 7.3 g/dL (ref 6.5–8.1)

## 2021-07-03 LAB — CBC WITH DIFFERENTIAL/PLATELET
Abs Immature Granulocytes: 0.13 10*3/uL — ABNORMAL HIGH (ref 0.00–0.07)
Basophils Absolute: 0.1 10*3/uL (ref 0.0–0.1)
Basophils Relative: 1 %
Eosinophils Absolute: 0.5 10*3/uL (ref 0.0–0.5)
Eosinophils Relative: 5 %
HCT: 36.5 % — ABNORMAL LOW (ref 39.0–52.0)
Hemoglobin: 12.9 g/dL — ABNORMAL LOW (ref 13.0–17.0)
Immature Granulocytes: 1 %
Lymphocytes Relative: 27 %
Lymphs Abs: 2.4 10*3/uL (ref 0.7–4.0)
MCH: 33.7 pg (ref 26.0–34.0)
MCHC: 35.3 g/dL (ref 30.0–36.0)
MCV: 95.3 fL (ref 80.0–100.0)
Monocytes Absolute: 0.9 10*3/uL (ref 0.1–1.0)
Monocytes Relative: 10 %
Neutro Abs: 5 10*3/uL (ref 1.7–7.7)
Neutrophils Relative %: 56 %
Platelets: 284 10*3/uL (ref 150–400)
RBC: 3.83 MIL/uL — ABNORMAL LOW (ref 4.22–5.81)
RDW: 11.3 % — ABNORMAL LOW (ref 11.5–15.5)
WBC: 9 10*3/uL (ref 4.0–10.5)
nRBC: 0 % (ref 0.0–0.2)

## 2021-07-03 LAB — HEMOGLOBIN A1C
Hgb A1c MFr Bld: 5.4 % (ref 4.8–5.6)
Mean Plasma Glucose: 108.28 mg/dL

## 2021-07-03 NOTE — Progress Notes (Addendum)
PCP - Catha Gosselin [facility])   PPM/ICD - denies   Chest x-ray -  EKG - 07/03/21 Stress Test - over 10 years ago, normal per patient  ECHO - denies Cardiac Cath - denies  Sleep Study - denies   Was a prediabetic and then it resolved  As of today, STOP taking any Aspirin (unless otherwise instructed by your surgeon) Aleve, Naproxen, Ibuprofen, Motrin, Advil, Goody's, BC's, all herbal medications, fish oil, and all vitamins.  ERAS Protcol -yes   COVID TEST- ambulatory surgery, not needed   Anesthesia review: yes,. Na 127  Patient denies shortness of breath, fever, cough and chest pain at PAT appointment   All instructions explained to the patient, with a verbal understanding of the material. Patient agrees to go over the instructions while at home for a better understanding. Patient also instructed to self quarantine after being tested for COVID-19. The opportunity to ask questions was provided.

## 2021-07-04 NOTE — Progress Notes (Signed)
Anesthesia Chart Review:   Case: [id number] Date/Time: 07/10/21 0915   Procedure: BILATERAL INGUINAL HERNIA REPAIR WITH MESH (Bilateral) - TAP BLOCK (B)   Anesthesia type: General   Pre-op diagnosis: BILATERAL INGUINAL HERNIA WITH MESH   Location: [facility] ROOM 02 / [facility] OR   Surgeons: Harriette Bouillon, MD       DISCUSSION: Pt is 70 years old with hx HTN, prediabetes  Na 127, Cl 90 at pre-admission testing. Records from PCP's office document PMH hyponatremia due to diuretic.  Last CMP at PCP's office 01/22/21 shows Na 128, Cl 91.  This appears to be patient's baseline.   Reviewed labs with Dr. Bradley Ferris 07/04/21. He advises recheck Na day of surgery, instruct pt to increase salt intake and decrease fluid intake if high.  I spoke with pt who reports Dr. Rosezena Sensor office contacted him already to have Na rechecked 07/08/21 and to increase salt. Pt reports he does not drink much fluid.   I spoke with Toniann Fail in Dr. Rosezena Sensor office 07/09/21 - pt had Na rechecked yesterday at [facility] and Na is still 127.    VS: BP (!) 168/89   Pulse 64   Temp 36.4 C   Resp 18   Ht 5\' 11"  (1.803 m)   Wt 84.6 kg   SpO2 100%   BMI 26.00 kg/m   PROVIDERS: - PCP is  , NP at [place] at [facility]:  Na 127, Cl 90  (all labs ordered are listed, but only abnormal results are displayed)  Labs Reviewed  CBC WITH DIFFERENTIAL/PLATELET - Abnormal; Notable for the following components:      Result Value   RBC 3.83 (*)    Hemoglobin 12.9 (*)    HCT 36.5 (*)    RDW 11.3 (*)    Abs Immature Granulocytes 0.13 (*)    All other components within normal limits  COMPREHENSIVE METABOLIC PANEL - Abnormal; Notable for the following components:   Sodium 127 (*)    Chloride 90 (*)    Glucose, Bld 103 (*)    All other components within normal limits  HEMOGLOBIN A1C     EKG 07/03/21: Sinus rhythm with 1st degree A-V block. Possible Anterior infarct , age undetermined   CV: N/A  Past Medical  History:  Diagnosis Date   Arthritis    GERD (gastroesophageal reflux disease)    Hypertension    Pre-diabetes    patient was told he is no longer prediabetic    Past Surgical History:  Procedure Laterality Date   COLONOSCOPY     FOOT SURGERY Bilateral    Neuromas   REVERSE SHOULDER ARTHROPLASTY Left 06/09/2019   Procedure: REVERSE SHOULDER ARTHROPLASTY;  Surgeon: 08/09/2019, MD;  Location: [facility] ORS;  Service: Orthopedics;  Laterality: Left;  with interscalene block   REVERSE SHOULDER ARTHROPLASTY Right 09/15/2019   Procedure: REVERSE SHOULDER ARTHROPLASTY;  Surgeon: 09/17/2019, MD;  Location: [facility] ORS;  Service: Orthopedics;  Laterality: Right;  interscalene block   UPPER GI ENDOSCOPY      MEDICATIONS:  cetirizine (ZYRTEC) 10 MG tablet   hydrochlorothiazide (MICROZIDE) 12.5 MG capsule   lisinopril (ZESTRIL) 10 MG tablet   metoprolol succinate (TOPROL-XL) 100 MG 24 hr tablet   Multiple Vitamin (MULTIVITAMIN) tablet   Omega-3 Fatty Acids (FISH OIL) 1000 MG CAPS   omeprazole (PRILOSEC) 20 MG capsule   Polyethyl Glycol-Propyl Glycol (LUBRICANT EYE DROPS) 0.4-0.3 % SOLN   No current facility-administered medications for this encounter.   If  no changes, I anticipate pt can proceed with surgery as scheduled.   Rica Mast, PhD, FNP-BC [facility] Short Stay Surgical Center/Anesthesiology Phone: [phone] 07/09/2021 10:52 AM

## 2021-07-08 DIAGNOSIS — E871 Hypo-osmolality and hyponatremia: Secondary | ICD-10-CM | POA: Diagnosis not present

## 2021-07-09 NOTE — Anesthesia Preprocedure Evaluation (Addendum)
Anesthesia Evaluation  Patient identified by MRN, date of birth, ID band Patient awake    Reviewed: Allergy & Precautions, NPO status , Patient's Chart, lab work & pertinent test results  Airway Mallampati: III  TM Distance: >3 FB Neck ROM: Full    Dental no notable dental hx. (+) Teeth Intact, Dental Advisory Given   Pulmonary neg pulmonary ROS, former smoker,    Pulmonary exam normal breath sounds clear to auscultation       Cardiovascular hypertension, Pt. on home beta blockers and Pt. on medications Normal cardiovascular exam Rhythm:Regular Rate:Normal     Neuro/Psych negative neurological ROS  negative psych ROS   GI/Hepatic Neg liver ROS, GERD  Medicated and Controlled,  Endo/Other  negative endocrine ROS  Renal/GU negative Renal ROS  negative genitourinary   Musculoskeletal negative musculoskeletal ROS (+)   Abdominal   Peds  Hematology negative hematology ROS (+)   Anesthesia Other Findings Chronic hyponatremia Na 128  Reproductive/Obstetrics                           Anesthesia Physical Anesthesia Plan  ASA: 2  Anesthesia Plan: General and Regional   Post-op Pain Management:  Regional for Post-op pain   Induction: Intravenous  PONV Risk Score and Plan: 2 and Midazolam, Dexamethasone and Ondansetron  Airway Management Planned: Oral ETT  Additional Equipment:   Intra-op Plan:   Post-operative Plan: Extubation in OR  Informed Consent: I have reviewed the patients History and Physical, chart, labs and discussed the procedure including the risks, benefits and alternatives for the proposed anesthesia with the patient or authorized representative who has indicated his/her understanding and acceptance.     Dental advisory given  Plan Discussed with: CRNA  Anesthesia Plan Comments: (See APP note by Joslyn Hy, FNP Chronic hyponatremia ~127)       Anesthesia Quick  Evaluation

## 2021-07-10 ENCOUNTER — Ambulatory Visit (HOSPITAL_COMMUNITY)
Admission: RE | Admit: 2021-07-10 | Discharge: 2021-07-10 | Disposition: A | Discharge status: Home / Self Care | Payer: BC Managed Care – PPO | Attending: Surgery | Admitting: Surgery

## 2021-07-10 ENCOUNTER — Ambulatory Visit (HOSPITAL_COMMUNITY): Payer: BC Managed Care – PPO | Admitting: Anesthesiology

## 2021-07-10 ENCOUNTER — Ambulatory Visit (HOSPITAL_COMMUNITY): Payer: BC Managed Care – PPO | Admitting: Emergency Medicine

## 2021-07-10 ENCOUNTER — Encounter (HOSPITAL_COMMUNITY)
Admission: RE | Disposition: A | Discharge status: Home / Self Care | Payer: Self-pay | Source: Home / Self Care | Attending: Surgery

## 2021-07-10 DIAGNOSIS — Z79899 Other long term (current) drug therapy: Secondary | ICD-10-CM | POA: Diagnosis not present

## 2021-07-10 DIAGNOSIS — K219 Gastro-esophageal reflux disease without esophagitis: Secondary | ICD-10-CM | POA: Diagnosis not present

## 2021-07-10 DIAGNOSIS — I10 Essential (primary) hypertension: Secondary | ICD-10-CM | POA: Diagnosis not present

## 2021-07-10 DIAGNOSIS — Z96612 Presence of left artificial shoulder joint: Secondary | ICD-10-CM | POA: Diagnosis not present

## 2021-07-10 DIAGNOSIS — Z96611 Presence of right artificial shoulder joint: Secondary | ICD-10-CM | POA: Diagnosis not present

## 2021-07-10 DIAGNOSIS — K4020 Bilateral inguinal hernia, without obstruction or gangrene, not specified as recurrent: Secondary | ICD-10-CM | POA: Insufficient documentation

## 2021-07-10 DIAGNOSIS — G8918 Other acute postprocedural pain: Secondary | ICD-10-CM | POA: Diagnosis not present

## 2021-07-10 DIAGNOSIS — Z87891 Personal history of nicotine dependence: Secondary | ICD-10-CM | POA: Diagnosis not present

## 2021-07-10 HISTORY — PX: INGUINAL HERNIA REPAIR: SHX194

## 2021-07-10 LAB — GLUCOSE, CAPILLARY: Glucose-Capillary: 84 mg/dL (ref 70–99)

## 2021-07-10 LAB — POCT I-STAT, CHEM 8
BUN: 15 mg/dL (ref 8–23)
Calcium, Ion: 1.05 mmol/L — ABNORMAL LOW (ref 1.15–1.40)
Chloride: 93 mmol/L — ABNORMAL LOW (ref 98–111)
Creatinine, Ser: 1.1 mg/dL (ref 0.61–1.24)
Glucose, Bld: 100 mg/dL — ABNORMAL HIGH (ref 70–99)
HCT: 37 % — ABNORMAL LOW (ref 39.0–52.0)
Hemoglobin: 12.6 g/dL — ABNORMAL LOW (ref 13.0–17.0)
Potassium: 3.9 mmol/L (ref 3.5–5.1)
Sodium: 128 mmol/L — ABNORMAL LOW (ref 135–145)
TCO2: 24 mmol/L (ref 22–32)

## 2021-07-10 SURGERY — REPAIR, HERNIA, INGUINAL, BILATERAL, ADULT
Anesthesia: Regional | Laterality: Bilateral

## 2021-07-10 MED ORDER — IBUPROFEN 800 MG PO TABS: 800.0000 mg | ORAL_TABLET | Freq: Three times a day (TID) | ORAL | 0 refills | Status: AC | PRN

## 2021-07-10 MED ORDER — EPHEDRINE SULFATE 50 MG/ML IJ SOLN
INTRAMUSCULAR | Status: DC | PRN
  Administered 2021-07-10 (×2): 5 mg via INTRAVENOUS
  Administered 2021-07-10: 10 mg via INTRAVENOUS
  Administered 2021-07-10: 5 mg via INTRAVENOUS

## 2021-07-10 MED ORDER — ONDANSETRON HCL 4 MG/2ML IJ SOLN
INTRAMUSCULAR | Status: DC | PRN
  Administered 2021-07-10: 4 mg via INTRAVENOUS

## 2021-07-10 MED ORDER — BUPIVACAINE-EPINEPHRINE (PF) 0.25% -1:200000 IJ SOLN
INTRAMUSCULAR | Status: AC
  Filled 2021-07-10: qty 30

## 2021-07-10 MED ORDER — LIDOCAINE HCL (CARDIAC) PF 100 MG/5ML IV SOSY
PREFILLED_SYRINGE | INTRAVENOUS | Status: DC | PRN
  Administered 2021-07-10: 20 mg via INTRAVENOUS

## 2021-07-10 MED ORDER — MIDAZOLAM HCL 2 MG/2ML IJ SOLN
INTRAMUSCULAR | Status: AC
  Administered 2021-07-10: 2 mg via INTRAVENOUS
  Filled 2021-07-10: qty 2

## 2021-07-10 MED ORDER — FENTANYL CITRATE (PF) 100 MCG/2ML IJ SOLN
25.0000 ug | INTRAMUSCULAR | Status: DC | PRN
  Administered 2021-07-10 (×2): 25 ug via INTRAVENOUS

## 2021-07-10 MED ORDER — DEXAMETHASONE SODIUM PHOSPHATE 10 MG/ML IJ SOLN
INTRAMUSCULAR | Status: DC | PRN
  Administered 2021-07-10: 10 mg via INTRAVENOUS

## 2021-07-10 MED ORDER — FENTANYL CITRATE (PF) 100 MCG/2ML IJ SOLN
INTRAMUSCULAR | Status: DC | PRN
  Administered 2021-07-10: 50 ug via INTRAVENOUS
  Administered 2021-07-10: 100 ug via INTRAVENOUS

## 2021-07-10 MED ORDER — EPHEDRINE 5 MG/ML INJ
INTRAVENOUS | Status: AC
  Filled 2021-07-10: qty 10

## 2021-07-10 MED ORDER — PROPOFOL 10 MG/ML IV BOLUS
INTRAVENOUS | Status: DC | PRN
  Administered 2021-07-10: 120 mg via INTRAVENOUS

## 2021-07-10 MED ORDER — CHLORHEXIDINE GLUCONATE CLOTH 2 % EX PADS: 6.0000 | MEDICATED_PAD | Freq: Once | CUTANEOUS | Status: DC

## 2021-07-10 MED ORDER — OXYCODONE HCL 5 MG PO TABS: 5.0000 mg | ORAL_TABLET | Freq: Four times a day (QID) | ORAL | 0 refills | Status: AC | PRN

## 2021-07-10 MED ORDER — DEXAMETHASONE SODIUM PHOSPHATE 10 MG/ML IJ SOLN
INTRAMUSCULAR | Status: DC | PRN
  Administered 2021-07-10 (×2): 5 mg

## 2021-07-10 MED ORDER — ROCURONIUM BROMIDE 10 MG/ML (PF) SYRINGE
PREFILLED_SYRINGE | INTRAVENOUS | Status: AC
  Filled 2021-07-10: qty 10

## 2021-07-10 MED ORDER — SUGAMMADEX SODIUM 500 MG/5ML IV SOLN
INTRAVENOUS | Status: DC | PRN
  Administered 2021-07-10: 200 mg via INTRAVENOUS

## 2021-07-10 MED ORDER — CHLORHEXIDINE GLUCONATE 0.12 % MT SOLN
15.0000 mL | Freq: Once | OROMUCOSAL | Status: AC
  Administered 2021-07-10: 15 mL via OROMUCOSAL
  Filled 2021-07-10: qty 15

## 2021-07-10 MED ORDER — ACETAMINOPHEN 500 MG PO TABS
1000.0000 mg | ORAL_TABLET | Freq: Once | ORAL | Status: AC
  Administered 2021-07-10: 1000 mg via ORAL
  Filled 2021-07-10: qty 2

## 2021-07-10 MED ORDER — MIDAZOLAM HCL 2 MG/2ML IJ SOLN
INTRAMUSCULAR | Status: AC
  Filled 2021-07-10: qty 2

## 2021-07-10 MED ORDER — FENTANYL CITRATE (PF) 100 MCG/2ML IJ SOLN
INTRAMUSCULAR | Status: AC
  Administered 2021-07-10: 50 ug
  Filled 2021-07-10: qty 2

## 2021-07-10 MED ORDER — FENTANYL CITRATE (PF) 100 MCG/2ML IJ SOLN
INTRAMUSCULAR | Status: AC
  Filled 2021-07-10: qty 2

## 2021-07-10 MED ORDER — FENTANYL CITRATE (PF) 250 MCG/5ML IJ SOLN
INTRAMUSCULAR | Status: AC
  Filled 2021-07-10: qty 5

## 2021-07-10 MED ORDER — MIDAZOLAM HCL 2 MG/2ML IJ SOLN: 2.0000 mg | Freq: Once | INTRAMUSCULAR | Status: AC

## 2021-07-10 MED ORDER — BUPIVACAINE-EPINEPHRINE 0.25% -1:200000 IJ SOLN
INTRAMUSCULAR | Status: DC | PRN
  Administered 2021-07-10: 10 mL

## 2021-07-10 MED ORDER — ROCURONIUM BROMIDE 100 MG/10ML IV SOLN
INTRAVENOUS | Status: DC | PRN
  Administered 2021-07-10: 80 mg via INTRAVENOUS

## 2021-07-10 MED ORDER — ONDANSETRON HCL 4 MG/2ML IJ SOLN
INTRAMUSCULAR | Status: AC
  Filled 2021-07-10: qty 2

## 2021-07-10 MED ORDER — CEFAZOLIN SODIUM-DEXTROSE 2-4 GM/100ML-% IV SOLN
2.0000 g | INTRAVENOUS | Status: AC
  Administered 2021-07-10: 2 g via INTRAVENOUS
  Filled 2021-07-10: qty 100

## 2021-07-10 MED ORDER — FENTANYL CITRATE PF 50 MCG/ML IJ SOSY: 50.0000 ug | PREFILLED_SYRINGE | Freq: Once | INTRAMUSCULAR | Status: DC

## 2021-07-10 MED ORDER — ORAL CARE MOUTH RINSE: 15.0000 mL | Freq: Once | OROMUCOSAL | Status: AC

## 2021-07-10 MED ORDER — LACTATED RINGERS IV SOLN: INTRAVENOUS | Status: DC

## 2021-07-10 MED ORDER — BUPIVACAINE HCL (PF) 0.25 % IJ SOLN
INTRAMUSCULAR | Status: DC | PRN
  Administered 2021-07-10 (×2): 30 mL via PERINEURAL

## 2021-07-10 MED ORDER — DEXAMETHASONE SODIUM PHOSPHATE 10 MG/ML IJ SOLN
INTRAMUSCULAR | Status: AC
  Filled 2021-07-10: qty 1

## 2021-07-10 MED ORDER — 0.9 % SODIUM CHLORIDE (POUR BTL) OPTIME
TOPICAL | Status: DC | PRN
  Administered 2021-07-10: 1000 mL

## 2021-07-10 MED ORDER — PHENYLEPHRINE 40 MCG/ML (10ML) SYRINGE FOR IV PUSH (FOR BLOOD PRESSURE SUPPORT)
PREFILLED_SYRINGE | INTRAVENOUS | Status: AC
  Filled 2021-07-10: qty 10

## 2021-07-10 MED ORDER — LIDOCAINE 2% (20 MG/ML) 5 ML SYRINGE
INTRAMUSCULAR | Status: AC
  Filled 2021-07-10: qty 5

## 2021-07-10 MED ORDER — PROPOFOL 10 MG/ML IV BOLUS
INTRAVENOUS | Status: AC
  Filled 2021-07-10: qty 20

## 2021-07-10 SURGICAL SUPPLY — 45 items
ADH SKN CLS APL DERMABOND .7 (GAUZE/BANDAGES/DRESSINGS) ×1
APL PRP STRL LF DISP 70% ISPRP (MISCELLANEOUS) ×2
BAG COUNTER SPONGE SURGICOUNT (BAG) ×2 IMPLANT
BAG SPNG CNTER NS LX DISP (BAG) ×1
BLADE CLIPPER SURG (BLADE) ×1 IMPLANT
CANISTER SUCT 3000ML PPV (MISCELLANEOUS) ×1 IMPLANT
CHLORAPREP W/TINT 26 (MISCELLANEOUS) ×3 IMPLANT
COVER SURGICAL LIGHT HANDLE (MISCELLANEOUS) ×2 IMPLANT
DERMABOND ADVANCED (GAUZE/BANDAGES/DRESSINGS) ×1
DERMABOND ADVANCED .7 DNX12 (GAUZE/BANDAGES/DRESSINGS) ×2 IMPLANT
DRAIN PENROSE 1/2X12 LTX STRL (WOUND CARE) ×1 IMPLANT
DRAPE LAPAROTOMY TRNSV 102X78 (DRAPES) ×2 IMPLANT
ELECT REM PT RETURN 9FT ADLT (ELECTROSURGICAL) ×2
ELECTRODE REM PT RTRN 9FT ADLT (ELECTROSURGICAL) ×1 IMPLANT
GLOVE SRG 8 PF TXTR STRL LF DI (GLOVE) ×1 IMPLANT
GLOVE SURG ENC MOIS LTX SZ8 (GLOVE) ×2 IMPLANT
GLOVE SURG UNDER POLY LF SZ8 (GLOVE) ×2
GOWN STRL REUS W/ TWL LRG LVL3 (GOWN DISPOSABLE) ×1 IMPLANT
GOWN STRL REUS W/ TWL XL LVL3 (GOWN DISPOSABLE) ×1 IMPLANT
GOWN STRL REUS W/TWL LRG LVL3 (GOWN DISPOSABLE) ×2
GOWN STRL REUS W/TWL XL LVL3 (GOWN DISPOSABLE) ×2
KIT BASIN OR (CUSTOM PROCEDURE TRAY) ×2 IMPLANT
KIT TURNOVER KIT B (KITS) ×2 IMPLANT
MESH HERNIA SYS ULTRAPRO LRG (Mesh General) ×2 IMPLANT
NDL HYPO 25GX1X1/2 BEV (NEEDLE) ×1 IMPLANT
NEEDLE HYPO 25GX1X1/2 BEV (NEEDLE) ×2 IMPLANT
NS IRRIG 1000ML POUR BTL (IV SOLUTION) ×2 IMPLANT
PACK GENERAL/GYN (CUSTOM PROCEDURE TRAY) ×2 IMPLANT
PAD ARMBOARD 7.5X6 YLW CONV (MISCELLANEOUS) ×2 IMPLANT
PENCIL SMOKE EVACUATOR (MISCELLANEOUS) ×2 IMPLANT
SUT MNCRL AB 4-0 PS2 18 (SUTURE) ×4 IMPLANT
SUT NOVA 0 T19/GS 22DT (SUTURE) ×4 IMPLANT
SUT NOVA NAB DX-16 0-1 5-0 T12 (SUTURE) ×4 IMPLANT
SUT NOVA NAB GS-21 0 18 T12 DT (SUTURE) ×2 IMPLANT
SUT SILK 2 0 SH (SUTURE) IMPLANT
SUT VIC AB 0 CT1 27 (SUTURE)
SUT VIC AB 0 CT1 27XBRD ANBCTR (SUTURE) IMPLANT
SUT VIC AB 2-0 SH 27 (SUTURE) ×8
SUT VIC AB 2-0 SH 27X BRD (SUTURE) ×2 IMPLANT
SUT VIC AB 2-0 SH 27XBRD (SUTURE) IMPLANT
SUT VIC AB 3-0 SH 18 (SUTURE) ×4 IMPLANT
SUT VICRYL AB 3 0 TIES (SUTURE) ×2 IMPLANT
SYR CONTROL 10ML LL (SYRINGE) ×2 IMPLANT
TOWEL GREEN STERILE (TOWEL DISPOSABLE) ×2 IMPLANT
TOWEL GREEN STERILE FF (TOWEL DISPOSABLE) ×2 IMPLANT

## 2021-07-10 NOTE — Anesthesia Procedure Notes (Signed)
Procedure Name: Intubation Date/Time: 07/10/2021 9:10 AM Performed by: Jonna Munro, CRNA Pre-anesthesia Checklist: Patient identified, Emergency Drugs available, Suction available, Patient being monitored and Timeout performed Patient Re-evaluated:Patient Re-evaluated prior to induction Oxygen Delivery Method: Circle system utilized Preoxygenation: Pre-oxygenation with 100% oxygen Induction Type: IV induction Ventilation: Mask ventilation without difficulty Laryngoscope Size: Mac, Glidescope and 3 Grade View: Grade I Tube type: Oral Laser Tube: Cuffed inflated with minimal occlusive pressure - saline Tube size: 7.5 mm Number of attempts: 3 Airway Equipment and Method: Stylet Placement Confirmation: ETT inserted through vocal cords under direct vision, positive ETCO2 and breath sounds checked- equal and bilateral Secured at: 23 cm Tube secured with: Tape Dental Injury: Teeth and Oropharynx as per pre-operative assessment  Comments: DL by CRNA, grade 2.5 view, ETT malpositioned, and removed. DL by Dr. Lanetta Inch, grade 3 view, glidescope obtained and patient intubated successfully with glidescope. Atraumatic intubation. VSS throughout induction.

## 2021-07-10 NOTE — Anesthesia Procedure Notes (Signed)
Anesthesia Regional Block: Quadratus lumborum   Pre-Anesthetic Checklist: , timeout performed,  Correct Patient, Correct Site, Correct Laterality,  Correct Procedure, Correct Position, site marked,  Risks and benefits discussed,  Surgical consent,  Pre-op evaluation,  At surgeon's request and post-op pain management  Laterality: Right  Prep: Maximum Sterile Barrier Precautions used, chloraprep       Needles:  Injection technique: Single-shot  Needle Type: Echogenic Stimulator Needle     Needle Length: 9cm  Needle Gauge: 22     Additional Needles:   Procedures:,,,, ultrasound used (permanent image in chart),,    Narrative:  Start time: 07/10/2021 8:35 AM End time: 07/10/2021 8:42 AM Injection made incrementally with aspirations every 5 mL.  Performed by: Personally  Anesthesiologist: Elmer Picker, MD  Additional Notes: Monitors applied. No increased pain on injection. No increased resistance to injection. Injection made in 5cc increments. Good needle visualization. Patient tolerated procedure well.

## 2021-07-10 NOTE — Discharge Instructions (Signed)
[facility] _______[facility]  UMBILICAL OR INGUINAL HERNIA REPAIR: POST OP INSTRUCTIONS  Always review your discharge instruction sheet given to you by the facility where your surgery was performed. IF YOU HAVE DISABILITY OR FAMILY LEAVE FORMS, YOU MUST BRING THEM TO THE OFFICE FOR PROCESSING.   DO NOT GIVE THEM TO YOUR DOCTOR.  1. A  prescription for pain medication may be given to you upon discharge.  Take your pain medication as prescribed, if needed.  If narcotic pain medicine is not needed, then you may take acetaminophen (Tylenol) or ibuprofen (Advil) as needed. 2. Take your usually prescribed medications unless otherwise directed. If you need a refill on your pain medication, please contact your pharmacy.  They will contact our office to request authorization. Prescriptions will not be filled after 5 pm or on week-ends. 3. You should follow a light diet the first 24 hours after arrival home, such as soup and crackers, etc.  Be sure to include lots of fluids daily.  Resume your normal diet the day after surgery. 4.Most patients will experience some swelling and bruising around the umbilicus or in the groin and scrotum.  Ice packs and reclining will help.  Swelling and bruising can take several days to resolve.  6. It is common to experience some constipation if taking pain medication after surgery.  Increasing fluid intake and taking a stool softener (such as Colace) will usually help or prevent this problem from occurring.  A mild laxative (Milk of Magnesia or Miralax) should be taken according to package directions if there are no bowel movements after 48 hours. 7. Unless discharge instructions indicate otherwise, you may remove your bandages 24-48 hours after surgery, and you may shower at that time.  You may have steri-strips (small skin tapes) in place directly over the incision.  These strips should be left on the skin for 7-10 days.  If your surgeon used skin glue on the  incision, you may shower in 24 hours.  The glue will flake off over the next 2-3 weeks.  Any sutures or staples will be removed at the office during your follow-up visit. 8. ACTIVITIES:  You may resume regular (light) daily activities beginning the next day--such as daily self-care, walking, climbing stairs--gradually increasing activities as tolerated.  You may have sexual intercourse when it is comfortable.  Refrain from any heavy lifting or straining until approved by your doctor.  a.You may drive when you are no longer taking prescription pain medication, you can comfortably wear a seatbelt, and you can safely maneuver your car and apply brakes. b.RETURN TO WORK:   _____________________________________________  9.You should see your doctor in the office for a follow-up appointment approximately 2-3 weeks after your surgery.  Make sure that you call for this appointment within a day or two after you arrive home to insure a convenient appointment time. 10.OTHER INSTRUCTIONS: _________________________    _____________________________________  WHEN TO CALL YOUR DOCTOR: Fever over 101.0 Inability to urinate Nausea and/or vomiting Extreme swelling or bruising Continued bleeding from incision. Increased pain, redness, or drainage from the incision  The clinic staff is available to answer your questions during regular business hours.  Please don't hesitate to call and ask to speak to one of the nurses for clinical concerns.  If you have a medical emergency, go to the nearest emergency room or call 911.  A surgeon from [facility] is always on call at the hospital   [address], [address],   [place], [place]  [address] ?  [address] [address], [place], [place]   [address] [phone] ? [phone] ? FAX [phone] Web site: [email or web address]  

## 2021-07-10 NOTE — Anesthesia Postprocedure Evaluation (Signed)
Anesthesia Post Note  Patient: Ronnie Moody  Procedure(s) Performed: BILATERAL INGUINAL HERNIA REPAIR WITH MESH (Bilateral)     Patient location during evaluation: PACU Anesthesia Type: Regional and General Level of consciousness: awake and alert Pain management: pain level controlled Vital Signs Assessment: post-procedure vital signs reviewed and stable Respiratory status: spontaneous breathing, nonlabored ventilation, respiratory function stable and patient connected to nasal cannula oxygen Cardiovascular status: blood pressure returned to baseline and stable Postop Assessment: no apparent nausea or vomiting Anesthetic complications: no   No notable events documented.  Last Vitals:  Vitals:   07/10/21 1130 07/10/21 1145  BP: (!) 153/78 (!) 153/78  Pulse: 75 75  Resp: 12 11  Temp:  36.8 C  SpO2: 99% 95%    Last Pain:  Vitals:   07/10/21 1145  TempSrc:   PainSc: 4                  Ashiah Karpowicz L Hamdi Kley

## 2021-07-10 NOTE — H&P (Signed)
Chief Complaint: Hernia   History of Present Illness: Ronnie Moody is a 70 y.o. male who is seen today for bilateral inguinal hernia. He has had this for some time. He returns today for follow-up of his hernias and to discuss surgery. He they are getting larger. They are causing mild discomfort but he has no signs of obstruction. He has no nausea vomiting fever chills but notices it more when he is active. He notices bulges in both groins with the right being greater than the left. These get smaller when he is recumbent..  Review of Systems: A complete review of systems was obtained from the patient. I have reviewed this information and discussed as appropriate with the patient. See HPI as well for other ROS.  Review of Systems  Constitutional: Negative.  HENT: Negative.  Eyes: Negative.  Respiratory: Negative.  Cardiovascular: Negative.  Gastrointestinal: Negative.  Genitourinary: Negative.  Musculoskeletal: Negative.  Skin: Negative.    Medical History: Past Medical History:  Diagnosis Date   GERD (gastroesophageal reflux disease)   There is no problem list on file for this patient.  Past Surgical History:  Procedure Laterality Date   TOTAL SHOULDER REPLACEMENT Bilateral    Not on File  Current Outpatient Medications on File Prior to Visit  Medication Sig Dispense Refill   acetaminophen (TYLENOL) 500 MG tablet Take by mouth   fluticasone propionate (FLONASE) 50 mcg/actuation nasal spray Place into one nostril   hydroCHLOROthiazide (MICROZIDE) 12.5 mg capsule Take by mouth   lisinopriL (ZESTRIL) 10 MG tablet Take 10 mg by mouth once daily   methocarbamoL (ROBAXIN) 500 MG tablet Take 500 mg by mouth 4 (four) times daily   metoprolol succinate (TOPROL-XL) 100 MG XL tablet Take 100 mg by mouth once daily   omeprazole (PRILOSEC) 20 MG DR capsule Take 20 mg by mouth once daily   No current facility-administered medications on file prior to visit.   History reviewed. No  pertinent family history.   Social History   Tobacco Use  Smoking Status Former Smoker  Smokeless Tobacco Never Used    Social History   Socioeconomic History   Marital status: Unknown  Tobacco Use   Smoking status: Former Smoker   Smokeless tobacco: Never Used  Building services engineer Use: Never used  Substance and Sexual Activity   Alcohol use: Yes   Drug use: Never   Objective:   Vitals:  06/10/21 1557  Weight: 80.7 kg (178 lb)  Height: 180.3 cm (5\' 11" )   Body mass index is 24.83 kg/m.  Physical Exam Constitutional:  Appearance: Normal appearance.  HENT:  Head: Normocephalic.  Nose: Nose normal.  Mouth/Throat:  Mouth: Mucous membranes are moist.  Eyes:  Pupils: Pupils are equal, round, and reactive to light.  Cardiovascular:  Rate and Rhythm: Normal rate.  Pulmonary:  Effort: Pulmonary effort is normal.  Breath sounds: No stridor.  Abdominal:  Tenderness: There is no abdominal tenderness.  Hernia: A hernia is present. Hernia is present in the left inguinal area and right inguinal area.   Comments: Bilateral reducible inguinal hernia  Musculoskeletal:  Cervical back: Normal range of motion.  Neurological:  Mental Status: He is alert.     Labs, Imaging and Diagnostic Testing:  none  Assessment and Plan:  Diagnoses and all orders for this visit:  Bilateral inguinal hernia without obstruction or gangrene, recurrence not specified    Discussed laparoscopic techniques and open techniques with the use of mesh both. Pros and cons of  each discussed. These are rather large inguinal hernias we discussed that as well. Complications, chronic pain, recurrence rates, organ injury, another complications were discussed today. The risk of either procedure is bleeding, infection, chronic pain, testicle pain, testicle loss, recurrence rates, organ injury, blood clot, stroke, death, DVT, the need further treatments and/or procedures. After discussing both laparoscopic  and open techniques, the patient is chosen open repair of his bilateral inguinal hernia with mesh. We discussed the implications of chronic pain as well. Recovery for each were discussed as well today. He wishes to proceed with open repair of bilateral inguinal hernia  No follow-ups on file.  Hayden Rasmussen, MD   I had direct face-to-face contact with the patient for a total of 20 minutes and greater than 50% of that time was spent providing counseling and/or coordination of care for the patient regardinghernia surgery . Electronically signed by Hayden Rasmussen, MD at

## 2021-07-10 NOTE — Interval H&P Note (Signed)
History and Physical Interval Note:  07/10/2021 8:25 AM  Ronnie Moody  has presented today for surgery, with the diagnosis of BILATERAL INGUINAL HERNIA WITH MESH.  The various methods of treatment have been discussed with the patient and family. After consideration of risks, benefits and other options for treatment, the patient has consented to  Procedure(s) with comments: BILATERAL INGUINAL HERNIA REPAIR WITH MESH (Bilateral) - TAP BLOCK (B) as a surgical intervention.  The patient's history has been reviewed, patient examined, no change in status, stable for surgery.  I have reviewed the patient's chart and labs.  Questions were answered to the patient's satisfaction.     Latosha Gaylord A Mostyn Varnell

## 2021-07-10 NOTE — Op Note (Signed)
Preoperative diagnosis: Bilateral renal hernia reducible  Postoperative diagnosis: Same  Procedure: Open repair of bilateral inguinal hernia with mesh  Surgeon: Thomas Cornett, MD  Anesthesia: General with bilateral TAP blocks and 0.25% Marcaine plain local  EBL: Minimal  Drains: None  Specimens: None  IV fluids: Per anesthesia record  Indications for procedure: The patient is a 70-year-old male who presents for repair of symptomatic bilateral inguinal hernia.  We discussed laparoscopic approach as well as open approaches and the use of mesh.  After discussion of all the above he chose bilateral open inguinal hernia repair with mesh.The risk of hernia repair include bleeding,  Infection,   Recurrence of the hernia,  Mesh use, chronic pain,  Organ injury,  Bowel injury,  Bladder injury,   nerve injury with numbness around the incision,  Death,  and worsening of preexisting  medical problems.  The alternatives to surgery have been discussed as well..  Long term expectations of both operative and non operative treatments have been discussed.   The patient agrees to proceed.    Description of procedure: The patient was met in the holding area and questions were answered.  He underwent bilateral TAP blocks by anesthesia.  He was then taken back to the operative room.  He was placed supine upon the OR table.  After induction of general esthesia, both inguinal regions were prepped and draped in a sterile fashion timeout performed.  Proper patient, site and procedure verified.  Right side was done first.  Local anesthetic of 0.25% Marcaine plain injected in the right inguinal crease.  A 5 cm incision was made in the right groin.  Dissection was carried down through the subcutaneous fat and through Scarpa's fascia.  The aponeurosis of the external bleak was identified.  A small incision was made the scalpel blade and the fibers were divided in the direction of the cord.  This opened the cord structures  from the for dissection.  We bluntly dissected around the cord.  There is a large direct hernia sac was dissected off the cord structures medially.  Once this was done it was reduced.  Penrose drain was used for retraction the cord.  A large ultra pro hernia system was used.  I open the floor the inguinal canal and placed the inner layer in the preperitoneal space.  This deployed well.  The onlay was placed onto the floor the inguinal canal.  A slit was cut for the cord structures.  We then secured the mesh circumferentially to the shelving edge of the inguinal ligament, the pubic tubercle and the conjoined tendon medially with 0 Novafil suture.  The cord structures were encircled with a mesh carefully and closed with the 0 Novafil.  Due to entrapment I ligated the ilioinguinal nerve on the right.  There is ample room for the cord to exit the mesh.  There is no undue tension.  Hemostasis was excellent.  We then closed the aponeurosis of the external bleak with a running 2-0 Vicryl.  3-0 Vicryl was used approximate Scarpa's fascia and 4 Monocryl used to close the skin in a subcuticular fashion.  Dermabond applied.  Left side was done in similar fashion.  A 5 center incision was made in the left inguinal crease.  Dissection carried down to the subcutaneous fat.  Scarpa's fascia was opened.  The aponeurosis of the external bleak was identified.  The fibers were opened with a scalpel blade and Metzenbaum scissors through the external ring.  We dissected around   the cord bluntly with a finger and a Penrose drain was placed.  A large indirect hernia sac was noted.  We dissected this off the cord structures.  There is no indirect hernia.  There was a lipoma that was dissected off and reduced back in the preperitoneal space.  A large ultra pro hernia system was used.  This was placed through a defect in the floor the anal canal in the preperitoneal space and deployed.  The onlay was placed onto the floor the canal.  A slit  was cut for the mesh to allow exiting of the cord.  It was secured in sterile fashion with 0 Novafil circumferentially.  The mesh was closed as well as 0 Novafil allowing ample room for the cord to exit with no undue constriction of the cord.  The ilioinguinal nerve was divided on left side as well to prevent postoperative neuralgia and entrapment.  The repair was under no tension.  Hemostasis achieved.  We closed the fascia with 2-0 Vicryl.  3-0 Vicryl was used to approximate Scarpa's fascia and 4 Monocryl was used to close the skin in a subcuticular cuticular fashion.  Dermabond applied.  All counts found to be correct.  The patient was awoke extubated taken recovery in satisfactory condition.

## 2021-07-10 NOTE — Anesthesia Procedure Notes (Signed)
Anesthesia Regional Block: Quadratus lumborum   Pre-Anesthetic Checklist: , timeout performed,  Correct Patient, Correct Site, Correct Laterality,  Correct Procedure, Correct Position, site marked,  Risks and benefits discussed,  Surgical consent,  Pre-op evaluation,  At surgeon's request and post-op pain management  Laterality: Left  Prep: Maximum Sterile Barrier Precautions used, chloraprep       Needles:  Injection technique: Single-shot  Needle Type: Echogenic Stimulator Needle     Needle Length: 9cm  Needle Gauge: 22     Additional Needles:   Procedures:,,,, ultrasound used (permanent image in chart),,    Narrative:  Start time: 07/10/2021 8:43 AM End time: 07/10/2021 8:47 AM Injection made incrementally with aspirations every 5 mL.  Performed by: Personally  Anesthesiologist: Elmer Picker, MD  Additional Notes: Monitors applied. No increased pain on injection. No increased resistance to injection. Injection made in 5cc increments. Good needle visualization. Patient tolerated procedure well.

## 2021-07-10 NOTE — Transfer of Care (Signed)
Immediate Anesthesia Transfer of Care Note  Patient: Ronnie Moody  Procedure(s) Performed: BILATERAL INGUINAL HERNIA REPAIR WITH MESH (Bilateral)  Patient Location: PACU  Anesthesia Type:General  Level of Consciousness: awake, alert , oriented and patient cooperative  Airway & Oxygen Therapy: Patient Spontanous Breathing and Patient connected to nasal cannula oxygen  Post-op Assessment: Report given to RN, Post -op Vital signs reviewed and stable and Patient moving all extremities X 4  Post vital signs: Reviewed and stable  Last Vitals:  Vitals Value Taken Time  BP 136/76 07/10/21 1115  Temp 36.8 C 07/10/21 1115  Pulse 75 07/10/21 1127  Resp 13 07/10/21 1127  SpO2 87 % 07/10/21 1127  Vitals shown include unvalidated device data.  Last Pain:  Vitals:   07/10/21 1115  TempSrc:   PainSc: 4          Complications: No notable events documented.

## 2021-07-11 ENCOUNTER — Encounter (HOSPITAL_COMMUNITY): Payer: Self-pay | Admitting: Surgery

## 2021-07-15 ENCOUNTER — Encounter (HOSPITAL_COMMUNITY): Payer: Self-pay | Admitting: Surgery

## 2021-08-12 DIAGNOSIS — D044 Carcinoma in situ of skin of scalp and neck: Secondary | ICD-10-CM | POA: Diagnosis not present

## 2021-08-12 DIAGNOSIS — C4442 Squamous cell carcinoma of skin of scalp and neck: Secondary | ICD-10-CM | POA: Diagnosis not present

## 2021-08-12 DIAGNOSIS — L570 Actinic keratosis: Secondary | ICD-10-CM | POA: Diagnosis not present

## 2021-11-27 DIAGNOSIS — L570 Actinic keratosis: Secondary | ICD-10-CM | POA: Diagnosis not present

## 2021-11-27 DIAGNOSIS — L814 Other melanin hyperpigmentation: Secondary | ICD-10-CM | POA: Diagnosis not present

## 2021-11-27 DIAGNOSIS — D229 Melanocytic nevi, unspecified: Secondary | ICD-10-CM | POA: Diagnosis not present

## 2021-11-27 DIAGNOSIS — Z85828 Personal history of other malignant neoplasm of skin: Secondary | ICD-10-CM | POA: Diagnosis not present

## 2021-11-27 DIAGNOSIS — L821 Other seborrheic keratosis: Secondary | ICD-10-CM | POA: Diagnosis not present

## 2022-01-29 DIAGNOSIS — Z0001 Encounter for general adult medical examination with abnormal findings: Secondary | ICD-10-CM | POA: Diagnosis not present

## 2022-01-29 DIAGNOSIS — E7800 Pure hypercholesterolemia, unspecified: Secondary | ICD-10-CM | POA: Diagnosis not present

## 2022-01-29 DIAGNOSIS — I10 Essential (primary) hypertension: Secondary | ICD-10-CM | POA: Diagnosis not present

## 2022-01-29 DIAGNOSIS — R7301 Impaired fasting glucose: Secondary | ICD-10-CM | POA: Diagnosis not present

## 2022-06-02 DIAGNOSIS — D229 Melanocytic nevi, unspecified: Secondary | ICD-10-CM | POA: Diagnosis not present

## 2022-06-02 DIAGNOSIS — L570 Actinic keratosis: Secondary | ICD-10-CM | POA: Diagnosis not present

## 2022-06-02 DIAGNOSIS — L578 Other skin changes due to chronic exposure to nonionizing radiation: Secondary | ICD-10-CM | POA: Diagnosis not present

## 2022-06-02 DIAGNOSIS — D044 Carcinoma in situ of skin of scalp and neck: Secondary | ICD-10-CM | POA: Diagnosis not present

## 2022-06-02 DIAGNOSIS — L814 Other melanin hyperpigmentation: Secondary | ICD-10-CM | POA: Diagnosis not present

## 2022-06-02 DIAGNOSIS — L821 Other seborrheic keratosis: Secondary | ICD-10-CM | POA: Diagnosis not present

## 2022-06-02 DIAGNOSIS — D485 Neoplasm of uncertain behavior of skin: Secondary | ICD-10-CM | POA: Diagnosis not present

## 2022-06-17 DIAGNOSIS — C4442 Squamous cell carcinoma of skin of scalp and neck: Secondary | ICD-10-CM | POA: Diagnosis not present

## 2022-07-30 DIAGNOSIS — K2270 Barrett's esophagus without dysplasia: Secondary | ICD-10-CM | POA: Diagnosis not present

## 2022-07-30 DIAGNOSIS — E7800 Pure hypercholesterolemia, unspecified: Secondary | ICD-10-CM | POA: Diagnosis not present

## 2022-07-30 DIAGNOSIS — Z79899 Other long term (current) drug therapy: Secondary | ICD-10-CM | POA: Diagnosis not present

## 2022-07-30 DIAGNOSIS — I10 Essential (primary) hypertension: Secondary | ICD-10-CM | POA: Diagnosis not present

## 2022-07-30 DIAGNOSIS — R7301 Impaired fasting glucose: Secondary | ICD-10-CM | POA: Diagnosis not present

## 2022-08-06 DIAGNOSIS — E871 Hypo-osmolality and hyponatremia: Secondary | ICD-10-CM | POA: Diagnosis not present

## 2022-09-01 DIAGNOSIS — Z79899 Other long term (current) drug therapy: Secondary | ICD-10-CM | POA: Diagnosis not present

## 2022-09-01 DIAGNOSIS — I10 Essential (primary) hypertension: Secondary | ICD-10-CM | POA: Diagnosis not present

## 2022-09-03 DIAGNOSIS — L309 Dermatitis, unspecified: Secondary | ICD-10-CM | POA: Diagnosis not present

## 2022-09-03 DIAGNOSIS — C4442 Squamous cell carcinoma of skin of scalp and neck: Secondary | ICD-10-CM | POA: Diagnosis not present

## 2022-09-03 DIAGNOSIS — D044 Carcinoma in situ of skin of scalp and neck: Secondary | ICD-10-CM | POA: Diagnosis not present

## 2022-09-03 DIAGNOSIS — L570 Actinic keratosis: Secondary | ICD-10-CM | POA: Diagnosis not present

## 2022-09-03 DIAGNOSIS — D485 Neoplasm of uncertain behavior of skin: Secondary | ICD-10-CM | POA: Diagnosis not present

## 2022-09-24 DIAGNOSIS — Z79899 Other long term (current) drug therapy: Secondary | ICD-10-CM | POA: Diagnosis not present

## 2022-09-24 DIAGNOSIS — K2270 Barrett's esophagus without dysplasia: Secondary | ICD-10-CM | POA: Diagnosis not present

## 2022-09-24 DIAGNOSIS — I10 Essential (primary) hypertension: Secondary | ICD-10-CM | POA: Diagnosis not present

## 2022-10-08 DIAGNOSIS — D044 Carcinoma in situ of skin of scalp and neck: Secondary | ICD-10-CM | POA: Diagnosis not present

## 2022-10-29 DIAGNOSIS — C4442 Squamous cell carcinoma of skin of scalp and neck: Secondary | ICD-10-CM | POA: Diagnosis not present

## 2022-11-11 DIAGNOSIS — I10 Essential (primary) hypertension: Secondary | ICD-10-CM | POA: Diagnosis not present

## 2022-11-11 DIAGNOSIS — R600 Localized edema: Secondary | ICD-10-CM | POA: Diagnosis not present

## 2022-12-08 DIAGNOSIS — J019 Acute sinusitis, unspecified: Secondary | ICD-10-CM | POA: Diagnosis not present

## 2023-01-20 DIAGNOSIS — D485 Neoplasm of uncertain behavior of skin: Secondary | ICD-10-CM | POA: Diagnosis not present

## 2023-01-20 DIAGNOSIS — L578 Other skin changes due to chronic exposure to nonionizing radiation: Secondary | ICD-10-CM | POA: Diagnosis not present

## 2023-01-20 DIAGNOSIS — L821 Other seborrheic keratosis: Secondary | ICD-10-CM | POA: Diagnosis not present

## 2023-01-20 DIAGNOSIS — D044 Carcinoma in situ of skin of scalp and neck: Secondary | ICD-10-CM | POA: Diagnosis not present

## 2023-01-20 DIAGNOSIS — D225 Melanocytic nevi of trunk: Secondary | ICD-10-CM | POA: Diagnosis not present

## 2023-01-20 DIAGNOSIS — L570 Actinic keratosis: Secondary | ICD-10-CM | POA: Diagnosis not present

## 2023-02-12 DIAGNOSIS — B001 Herpesviral vesicular dermatitis: Secondary | ICD-10-CM | POA: Diagnosis not present

## 2023-02-12 DIAGNOSIS — I10 Essential (primary) hypertension: Secondary | ICD-10-CM | POA: Diagnosis not present

## 2023-02-12 DIAGNOSIS — E7800 Pure hypercholesterolemia, unspecified: Secondary | ICD-10-CM | POA: Diagnosis not present

## 2023-02-12 DIAGNOSIS — E871 Hypo-osmolality and hyponatremia: Secondary | ICD-10-CM | POA: Diagnosis not present

## 2023-02-18 DIAGNOSIS — E871 Hypo-osmolality and hyponatremia: Secondary | ICD-10-CM | POA: Diagnosis not present

## 2023-02-18 DIAGNOSIS — I10 Essential (primary) hypertension: Secondary | ICD-10-CM | POA: Diagnosis not present

## 2023-03-04 DIAGNOSIS — D044 Carcinoma in situ of skin of scalp and neck: Secondary | ICD-10-CM | POA: Diagnosis not present

## 2023-05-19 DIAGNOSIS — E7800 Pure hypercholesterolemia, unspecified: Secondary | ICD-10-CM | POA: Diagnosis not present

## 2023-05-19 DIAGNOSIS — Z1389 Encounter for screening for other disorder: Secondary | ICD-10-CM | POA: Diagnosis not present

## 2023-05-19 DIAGNOSIS — Z0000 Encounter for general adult medical examination without abnormal findings: Secondary | ICD-10-CM | POA: Diagnosis not present

## 2023-05-19 DIAGNOSIS — E871 Hypo-osmolality and hyponatremia: Secondary | ICD-10-CM | POA: Diagnosis not present

## 2023-08-06 DIAGNOSIS — D485 Neoplasm of uncertain behavior of skin: Secondary | ICD-10-CM | POA: Diagnosis not present

## 2023-08-06 DIAGNOSIS — L570 Actinic keratosis: Secondary | ICD-10-CM | POA: Diagnosis not present

## 2023-08-06 DIAGNOSIS — D2339 Other benign neoplasm of skin of other parts of face: Secondary | ICD-10-CM | POA: Diagnosis not present

## 2023-08-06 DIAGNOSIS — L578 Other skin changes due to chronic exposure to nonionizing radiation: Secondary | ICD-10-CM | POA: Diagnosis not present

## 2023-08-06 DIAGNOSIS — D0439 Carcinoma in situ of skin of other parts of face: Secondary | ICD-10-CM | POA: Diagnosis not present

## 2023-08-06 DIAGNOSIS — C44319 Basal cell carcinoma of skin of other parts of face: Secondary | ICD-10-CM | POA: Diagnosis not present

## 2023-08-06 DIAGNOSIS — Z85828 Personal history of other malignant neoplasm of skin: Secondary | ICD-10-CM | POA: Diagnosis not present

## 2023-08-06 DIAGNOSIS — D225 Melanocytic nevi of trunk: Secondary | ICD-10-CM | POA: Diagnosis not present

## 2023-08-06 DIAGNOSIS — L821 Other seborrheic keratosis: Secondary | ICD-10-CM | POA: Diagnosis not present

## 2023-08-06 DIAGNOSIS — L905 Scar conditions and fibrosis of skin: Secondary | ICD-10-CM | POA: Diagnosis not present

## 2023-08-13 DIAGNOSIS — D123 Benign neoplasm of transverse colon: Secondary | ICD-10-CM | POA: Diagnosis not present

## 2023-08-13 DIAGNOSIS — Z860101 Personal history of adenomatous and serrated colon polyps: Secondary | ICD-10-CM | POA: Diagnosis not present

## 2023-08-13 DIAGNOSIS — Z09 Encounter for follow-up examination after completed treatment for conditions other than malignant neoplasm: Secondary | ICD-10-CM | POA: Diagnosis not present

## 2023-08-13 DIAGNOSIS — K5730 Diverticulosis of large intestine without perforation or abscess without bleeding: Secondary | ICD-10-CM | POA: Diagnosis not present

## 2023-08-13 DIAGNOSIS — K2270 Barrett's esophagus without dysplasia: Secondary | ICD-10-CM | POA: Diagnosis not present

## 2023-08-13 DIAGNOSIS — K649 Unspecified hemorrhoids: Secondary | ICD-10-CM | POA: Diagnosis not present

## 2023-08-13 DIAGNOSIS — K2930 Chronic superficial gastritis without bleeding: Secondary | ICD-10-CM | POA: Diagnosis not present

## 2023-08-13 DIAGNOSIS — D122 Benign neoplasm of ascending colon: Secondary | ICD-10-CM | POA: Diagnosis not present

## 2023-08-16 DIAGNOSIS — C44319 Basal cell carcinoma of skin of other parts of face: Secondary | ICD-10-CM | POA: Diagnosis not present

## 2023-11-17 DIAGNOSIS — E871 Hypo-osmolality and hyponatremia: Secondary | ICD-10-CM | POA: Diagnosis not present

## 2023-11-17 DIAGNOSIS — R7303 Prediabetes: Secondary | ICD-10-CM | POA: Diagnosis not present

## 2023-11-17 DIAGNOSIS — E7800 Pure hypercholesterolemia, unspecified: Secondary | ICD-10-CM | POA: Diagnosis not present

## 2023-11-17 DIAGNOSIS — I10 Essential (primary) hypertension: Secondary | ICD-10-CM | POA: Diagnosis not present

## 2023-11-19 DIAGNOSIS — E875 Hyperkalemia: Secondary | ICD-10-CM | POA: Diagnosis not present

## 2023-11-25 DIAGNOSIS — I10 Essential (primary) hypertension: Secondary | ICD-10-CM | POA: Diagnosis not present

## 2023-11-25 DIAGNOSIS — E875 Hyperkalemia: Secondary | ICD-10-CM | POA: Diagnosis not present

## 2023-12-14 DIAGNOSIS — I10 Essential (primary) hypertension: Secondary | ICD-10-CM | POA: Diagnosis not present

## 2024-02-09 DIAGNOSIS — Z85828 Personal history of other malignant neoplasm of skin: Secondary | ICD-10-CM | POA: Diagnosis not present

## 2024-02-09 DIAGNOSIS — L570 Actinic keratosis: Secondary | ICD-10-CM | POA: Diagnosis not present

## 2024-02-09 DIAGNOSIS — L821 Other seborrheic keratosis: Secondary | ICD-10-CM | POA: Diagnosis not present

## 2024-02-09 DIAGNOSIS — D225 Melanocytic nevi of trunk: Secondary | ICD-10-CM | POA: Diagnosis not present

## 2024-02-09 DIAGNOSIS — L578 Other skin changes due to chronic exposure to nonionizing radiation: Secondary | ICD-10-CM | POA: Diagnosis not present

## 2024-05-23 DIAGNOSIS — Z0000 Encounter for general adult medical examination without abnormal findings: Secondary | ICD-10-CM | POA: Diagnosis not present

## 2024-05-23 DIAGNOSIS — I10 Essential (primary) hypertension: Secondary | ICD-10-CM | POA: Diagnosis not present

## 2024-05-23 DIAGNOSIS — E7800 Pure hypercholesterolemia, unspecified: Secondary | ICD-10-CM | POA: Diagnosis not present

## 2024-05-23 DIAGNOSIS — Z23 Encounter for immunization: Secondary | ICD-10-CM | POA: Diagnosis not present

## 2024-05-23 DIAGNOSIS — G720 Drug-induced myopathy: Secondary | ICD-10-CM | POA: Diagnosis not present

## 2024-05-23 DIAGNOSIS — K2270 Barrett's esophagus without dysplasia: Secondary | ICD-10-CM | POA: Diagnosis not present
